# Patient Record
Sex: Female | Born: 1935 | Race: White | Hispanic: No | Marital: Married | Smoking: Never smoker
Health system: Southern US, Community
[De-identification: ages and names within clinical notes are randomized; demographics above are authoritative.]

## PROBLEM LIST (undated history)

## (undated) DIAGNOSIS — E079 Disorder of thyroid, unspecified: Secondary | ICD-10-CM

## (undated) DIAGNOSIS — N200 Calculus of kidney: Secondary | ICD-10-CM

## (undated) DIAGNOSIS — K529 Noninfective gastroenteritis and colitis, unspecified: Secondary | ICD-10-CM

## (undated) DIAGNOSIS — K5790 Diverticulosis of intestine, part unspecified, without perforation or abscess without bleeding: Secondary | ICD-10-CM

## (undated) DIAGNOSIS — E039 Hypothyroidism, unspecified: Secondary | ICD-10-CM

## (undated) DIAGNOSIS — I10 Essential (primary) hypertension: Secondary | ICD-10-CM

## (undated) DIAGNOSIS — G309 Alzheimer's disease, unspecified: Secondary | ICD-10-CM

## (undated) DIAGNOSIS — D1803 Hemangioma of intra-abdominal structures: Secondary | ICD-10-CM

## (undated) DIAGNOSIS — F0280 Dementia in other diseases classified elsewhere without behavioral disturbance: Secondary | ICD-10-CM

## (undated) DIAGNOSIS — K52832 Lymphocytic colitis: Secondary | ICD-10-CM

## (undated) HISTORY — DX: Alzheimer's disease, unspecified: G30.9

## (undated) HISTORY — DX: Dementia in other diseases classified elsewhere, unspecified severity, without behavioral disturbance, psychotic disturbance, mood disturbance, and anxiety: F02.80

## (undated) HISTORY — DX: Lymphocytic colitis: K52.832

## (undated) HISTORY — DX: Diverticulosis of intestine, part unspecified, without perforation or abscess without bleeding: K57.90

## (undated) HISTORY — PX: EYE SURGERY: SHX253

## (undated) HISTORY — DX: Hemangioma of intra-abdominal structures: D18.03

## (undated) HISTORY — DX: Calculus of kidney: N20.0

## (undated) HISTORY — PX: OTHER SURGICAL HISTORY: SHX169

## (undated) HISTORY — PX: BREAST LUMPECTOMY: SHX2

## (undated) HISTORY — DX: Hypothyroidism, unspecified: E03.9

---

## 1979-08-28 HISTORY — PX: OTHER SURGICAL HISTORY: SHX169

## 1980-08-27 HISTORY — PX: BREAST CYST ASPIRATION: SHX578

## 1998-05-17 ENCOUNTER — Ambulatory Visit (HOSPITAL_COMMUNITY): Admission: RE | Admit: 1998-05-17 | Discharge: 1998-05-17 | Payer: Self-pay | Admitting: *Deleted

## 1999-09-14 ENCOUNTER — Encounter: Admission: RE | Admit: 1999-09-14 | Discharge: 1999-10-31 | Payer: Self-pay | Admitting: *Deleted

## 2000-06-21 ENCOUNTER — Other Ambulatory Visit: Admission: RE | Admit: 2000-06-21 | Discharge: 2000-06-21 | Payer: Self-pay | Admitting: Obstetrics and Gynecology

## 2001-06-27 ENCOUNTER — Other Ambulatory Visit: Admission: RE | Admit: 2001-06-27 | Discharge: 2001-06-27 | Payer: Self-pay | Admitting: Obstetrics and Gynecology

## 2003-02-03 ENCOUNTER — Encounter: Payer: Self-pay | Admitting: Internal Medicine

## 2003-02-03 ENCOUNTER — Encounter: Admission: RE | Admit: 2003-02-03 | Discharge: 2003-02-03 | Payer: Self-pay | Admitting: Internal Medicine

## 2004-01-21 ENCOUNTER — Ambulatory Visit (HOSPITAL_COMMUNITY): Admission: RE | Admit: 2004-01-21 | Discharge: 2004-01-21 | Payer: Self-pay | Admitting: Gastroenterology

## 2004-08-27 HISTORY — PX: OTHER SURGICAL HISTORY: SHX169

## 2004-11-13 ENCOUNTER — Encounter: Admission: RE | Admit: 2004-11-13 | Discharge: 2005-01-03 | Payer: Self-pay | Admitting: Family Medicine

## 2005-01-08 ENCOUNTER — Encounter: Admission: RE | Admit: 2005-01-08 | Discharge: 2005-01-08 | Payer: Self-pay | Admitting: Orthopaedic Surgery

## 2005-01-09 ENCOUNTER — Ambulatory Visit (HOSPITAL_BASED_OUTPATIENT_CLINIC_OR_DEPARTMENT_OTHER): Admission: RE | Admit: 2005-01-09 | Discharge: 2005-01-09 | Payer: Self-pay | Admitting: Orthopaedic Surgery

## 2005-01-09 ENCOUNTER — Ambulatory Visit (HOSPITAL_COMMUNITY): Admission: RE | Admit: 2005-01-09 | Discharge: 2005-01-09 | Payer: Self-pay | Admitting: Orthopaedic Surgery

## 2005-01-11 ENCOUNTER — Encounter: Admission: RE | Admit: 2005-01-11 | Discharge: 2005-04-11 | Payer: Self-pay | Admitting: Orthopaedic Surgery

## 2010-02-24 DIAGNOSIS — K52832 Lymphocytic colitis: Secondary | ICD-10-CM

## 2010-02-24 HISTORY — DX: Lymphocytic colitis: K52.832

## 2011-01-02 ENCOUNTER — Other Ambulatory Visit: Payer: Self-pay | Admitting: Internal Medicine

## 2011-01-02 DIAGNOSIS — R945 Abnormal results of liver function studies: Secondary | ICD-10-CM

## 2011-01-05 ENCOUNTER — Ambulatory Visit
Admission: RE | Admit: 2011-01-05 | Discharge: 2011-01-05 | Disposition: A | Discharge status: Home / Self Care | Payer: Medicare Other | Source: Ambulatory Visit | Attending: Internal Medicine | Admitting: Internal Medicine

## 2011-01-05 DIAGNOSIS — R945 Abnormal results of liver function studies: Secondary | ICD-10-CM

## 2012-02-19 ENCOUNTER — Other Ambulatory Visit: Payer: Self-pay | Admitting: Internal Medicine

## 2012-02-19 DIAGNOSIS — R945 Abnormal results of liver function studies: Secondary | ICD-10-CM

## 2012-02-26 ENCOUNTER — Ambulatory Visit
Admission: RE | Admit: 2012-02-26 | Discharge: 2012-02-26 | Disposition: A | Discharge status: Home / Self Care | Payer: Medicare Other | Source: Ambulatory Visit | Attending: Internal Medicine | Admitting: Internal Medicine

## 2012-02-26 DIAGNOSIS — R945 Abnormal results of liver function studies: Secondary | ICD-10-CM

## 2014-09-22 DIAGNOSIS — Z1231 Encounter for screening mammogram for malignant neoplasm of breast: Secondary | ICD-10-CM | POA: Diagnosis not present

## 2014-10-04 DIAGNOSIS — E039 Hypothyroidism, unspecified: Secondary | ICD-10-CM | POA: Diagnosis not present

## 2014-10-04 DIAGNOSIS — E559 Vitamin D deficiency, unspecified: Secondary | ICD-10-CM | POA: Diagnosis not present

## 2014-10-04 DIAGNOSIS — I10 Essential (primary) hypertension: Secondary | ICD-10-CM | POA: Diagnosis not present

## 2014-10-07 ENCOUNTER — Other Ambulatory Visit: Payer: Self-pay | Admitting: Internal Medicine

## 2014-10-07 DIAGNOSIS — R740 Nonspecific elevation of levels of transaminase and lactic acid dehydrogenase [LDH]: Secondary | ICD-10-CM | POA: Diagnosis not present

## 2014-10-07 DIAGNOSIS — R7402 Elevation of levels of lactic acid dehydrogenase (LDH): Secondary | ICD-10-CM

## 2014-10-07 DIAGNOSIS — E559 Vitamin D deficiency, unspecified: Secondary | ICD-10-CM | POA: Diagnosis not present

## 2014-10-07 DIAGNOSIS — E039 Hypothyroidism, unspecified: Secondary | ICD-10-CM | POA: Diagnosis not present

## 2014-10-07 DIAGNOSIS — I10 Essential (primary) hypertension: Secondary | ICD-10-CM | POA: Diagnosis not present

## 2014-10-15 ENCOUNTER — Ambulatory Visit
Admission: RE | Admit: 2014-10-15 | Discharge: 2014-10-15 | Disposition: A | Discharge status: Home / Self Care | Payer: Commercial Managed Care - HMO | Source: Ambulatory Visit | Attending: Internal Medicine | Admitting: Internal Medicine

## 2014-10-15 DIAGNOSIS — R7401 Elevation of levels of liver transaminase levels: Secondary | ICD-10-CM

## 2014-10-15 DIAGNOSIS — R740 Nonspecific elevation of levels of transaminase and lactic acid dehydrogenase [LDH]: Principal | ICD-10-CM

## 2014-10-15 DIAGNOSIS — N281 Cyst of kidney, acquired: Secondary | ICD-10-CM | POA: Diagnosis not present

## 2014-11-22 DIAGNOSIS — H2513 Age-related nuclear cataract, bilateral: Secondary | ICD-10-CM | POA: Diagnosis not present

## 2014-11-22 DIAGNOSIS — H524 Presbyopia: Secondary | ICD-10-CM | POA: Diagnosis not present

## 2015-01-17 DIAGNOSIS — H8110 Benign paroxysmal vertigo, unspecified ear: Secondary | ICD-10-CM | POA: Diagnosis not present

## 2015-01-31 DIAGNOSIS — H8110 Benign paroxysmal vertigo, unspecified ear: Secondary | ICD-10-CM | POA: Diagnosis not present

## 2015-03-24 DIAGNOSIS — Z01419 Encounter for gynecological examination (general) (routine) without abnormal findings: Secondary | ICD-10-CM | POA: Diagnosis not present

## 2015-04-07 DIAGNOSIS — E559 Vitamin D deficiency, unspecified: Secondary | ICD-10-CM | POA: Diagnosis not present

## 2015-04-07 DIAGNOSIS — N390 Urinary tract infection, site not specified: Secondary | ICD-10-CM | POA: Diagnosis not present

## 2015-04-07 DIAGNOSIS — I10 Essential (primary) hypertension: Secondary | ICD-10-CM | POA: Diagnosis not present

## 2015-04-07 DIAGNOSIS — E039 Hypothyroidism, unspecified: Secondary | ICD-10-CM | POA: Diagnosis not present

## 2015-04-14 DIAGNOSIS — E039 Hypothyroidism, unspecified: Secondary | ICD-10-CM | POA: Diagnosis not present

## 2015-04-14 DIAGNOSIS — R740 Nonspecific elevation of levels of transaminase and lactic acid dehydrogenase [LDH]: Secondary | ICD-10-CM | POA: Diagnosis not present

## 2015-04-14 DIAGNOSIS — E559 Vitamin D deficiency, unspecified: Secondary | ICD-10-CM | POA: Diagnosis not present

## 2015-04-14 DIAGNOSIS — I10 Essential (primary) hypertension: Secondary | ICD-10-CM | POA: Diagnosis not present

## 2015-06-16 DIAGNOSIS — Z23 Encounter for immunization: Secondary | ICD-10-CM | POA: Diagnosis not present

## 2015-06-29 DIAGNOSIS — R21 Rash and other nonspecific skin eruption: Secondary | ICD-10-CM | POA: Diagnosis not present

## 2015-07-07 DIAGNOSIS — T500X5A Adverse effect of mineralocorticoids and their antagonists, initial encounter: Secondary | ICD-10-CM | POA: Diagnosis not present

## 2015-09-05 ENCOUNTER — Telehealth: Payer: Self-pay

## 2015-09-05 NOTE — Telephone Encounter (Signed)
Prior auth for Eliquis 2.5mg sent to [facility]. 

## 2015-09-26 DIAGNOSIS — Z1231 Encounter for screening mammogram for malignant neoplasm of breast: Secondary | ICD-10-CM | POA: Diagnosis not present

## 2015-10-11 DIAGNOSIS — E559 Vitamin D deficiency, unspecified: Secondary | ICD-10-CM | POA: Diagnosis not present

## 2015-10-11 DIAGNOSIS — E039 Hypothyroidism, unspecified: Secondary | ICD-10-CM | POA: Diagnosis not present

## 2015-10-11 DIAGNOSIS — I10 Essential (primary) hypertension: Secondary | ICD-10-CM | POA: Diagnosis not present

## 2015-10-19 DIAGNOSIS — I10 Essential (primary) hypertension: Secondary | ICD-10-CM | POA: Diagnosis not present

## 2015-10-19 DIAGNOSIS — Z1389 Encounter for screening for other disorder: Secondary | ICD-10-CM | POA: Diagnosis not present

## 2015-10-19 DIAGNOSIS — K7689 Other specified diseases of liver: Secondary | ICD-10-CM | POA: Diagnosis not present

## 2015-10-19 DIAGNOSIS — R946 Abnormal results of thyroid function studies: Secondary | ICD-10-CM | POA: Diagnosis not present

## 2015-11-22 DIAGNOSIS — H2511 Age-related nuclear cataract, right eye: Secondary | ICD-10-CM | POA: Diagnosis not present

## 2015-11-22 DIAGNOSIS — H53002 Unspecified amblyopia, left eye: Secondary | ICD-10-CM | POA: Diagnosis not present

## 2015-11-22 DIAGNOSIS — H524 Presbyopia: Secondary | ICD-10-CM | POA: Diagnosis not present

## 2015-12-27 DIAGNOSIS — H35033 Hypertensive retinopathy, bilateral: Secondary | ICD-10-CM | POA: Diagnosis not present

## 2015-12-27 DIAGNOSIS — H1131 Conjunctival hemorrhage, right eye: Secondary | ICD-10-CM | POA: Diagnosis not present

## 2016-01-24 DIAGNOSIS — H5203 Hypermetropia, bilateral: Secondary | ICD-10-CM | POA: Diagnosis not present

## 2016-01-24 DIAGNOSIS — H5213 Myopia, bilateral: Secondary | ICD-10-CM | POA: Diagnosis not present

## 2016-01-24 DIAGNOSIS — Z0100 Encounter for examination of eyes and vision without abnormal findings: Secondary | ICD-10-CM | POA: Diagnosis not present

## 2016-01-24 DIAGNOSIS — H52209 Unspecified astigmatism, unspecified eye: Secondary | ICD-10-CM | POA: Diagnosis not present

## 2016-01-24 DIAGNOSIS — H524 Presbyopia: Secondary | ICD-10-CM | POA: Diagnosis not present

## 2016-04-12 DIAGNOSIS — I10 Essential (primary) hypertension: Secondary | ICD-10-CM | POA: Diagnosis not present

## 2016-04-12 DIAGNOSIS — R946 Abnormal results of thyroid function studies: Secondary | ICD-10-CM | POA: Diagnosis not present

## 2016-04-13 DIAGNOSIS — Z1389 Encounter for screening for other disorder: Secondary | ICD-10-CM | POA: Diagnosis not present

## 2016-04-13 DIAGNOSIS — Z130 Encounter for screening for diseases of the blood and blood-forming organs and certain disorders involving the immune mechanism: Secondary | ICD-10-CM | POA: Diagnosis not present

## 2016-04-13 DIAGNOSIS — Z01419 Encounter for gynecological examination (general) (routine) without abnormal findings: Secondary | ICD-10-CM | POA: Diagnosis not present

## 2016-04-17 DIAGNOSIS — Z01419 Encounter for gynecological examination (general) (routine) without abnormal findings: Secondary | ICD-10-CM | POA: Diagnosis not present

## 2016-04-25 DIAGNOSIS — Z0000 Encounter for general adult medical examination without abnormal findings: Secondary | ICD-10-CM | POA: Diagnosis not present

## 2016-04-25 DIAGNOSIS — R740 Nonspecific elevation of levels of transaminase and lactic acid dehydrogenase [LDH]: Secondary | ICD-10-CM | POA: Diagnosis not present

## 2016-04-25 DIAGNOSIS — I10 Essential (primary) hypertension: Secondary | ICD-10-CM | POA: Diagnosis not present

## 2016-04-25 DIAGNOSIS — Z23 Encounter for immunization: Secondary | ICD-10-CM | POA: Diagnosis not present

## 2016-04-25 DIAGNOSIS — E039 Hypothyroidism, unspecified: Secondary | ICD-10-CM | POA: Diagnosis not present

## 2016-06-18 DIAGNOSIS — L259 Unspecified contact dermatitis, unspecified cause: Secondary | ICD-10-CM | POA: Diagnosis not present

## 2016-08-22 DIAGNOSIS — H04123 Dry eye syndrome of bilateral lacrimal glands: Secondary | ICD-10-CM | POA: Diagnosis not present

## 2016-08-22 DIAGNOSIS — R51 Headache: Secondary | ICD-10-CM | POA: Diagnosis not present

## 2016-10-08 DIAGNOSIS — Z1231 Encounter for screening mammogram for malignant neoplasm of breast: Secondary | ICD-10-CM | POA: Diagnosis not present

## 2016-10-24 DIAGNOSIS — I10 Essential (primary) hypertension: Secondary | ICD-10-CM | POA: Diagnosis not present

## 2016-10-24 DIAGNOSIS — Z0000 Encounter for general adult medical examination without abnormal findings: Secondary | ICD-10-CM | POA: Diagnosis not present

## 2016-10-24 DIAGNOSIS — E039 Hypothyroidism, unspecified: Secondary | ICD-10-CM | POA: Diagnosis not present

## 2016-10-24 DIAGNOSIS — E559 Vitamin D deficiency, unspecified: Secondary | ICD-10-CM | POA: Diagnosis not present

## 2016-12-05 DIAGNOSIS — H52221 Regular astigmatism, right eye: Secondary | ICD-10-CM | POA: Diagnosis not present

## 2016-12-05 DIAGNOSIS — H524 Presbyopia: Secondary | ICD-10-CM | POA: Diagnosis not present

## 2016-12-05 DIAGNOSIS — H04123 Dry eye syndrome of bilateral lacrimal glands: Secondary | ICD-10-CM | POA: Diagnosis not present

## 2016-12-05 DIAGNOSIS — H35033 Hypertensive retinopathy, bilateral: Secondary | ICD-10-CM | POA: Diagnosis not present

## 2016-12-05 DIAGNOSIS — H2511 Age-related nuclear cataract, right eye: Secondary | ICD-10-CM | POA: Diagnosis not present

## 2016-12-05 DIAGNOSIS — H53002 Unspecified amblyopia, left eye: Secondary | ICD-10-CM | POA: Diagnosis not present

## 2016-12-05 DIAGNOSIS — H5201 Hypermetropia, right eye: Secondary | ICD-10-CM | POA: Diagnosis not present

## 2017-03-14 DIAGNOSIS — H5203 Hypermetropia, bilateral: Secondary | ICD-10-CM | POA: Diagnosis not present

## 2017-03-14 DIAGNOSIS — H524 Presbyopia: Secondary | ICD-10-CM | POA: Diagnosis not present

## 2017-03-14 DIAGNOSIS — H52209 Unspecified astigmatism, unspecified eye: Secondary | ICD-10-CM | POA: Diagnosis not present

## 2017-03-14 DIAGNOSIS — H5213 Myopia, bilateral: Secondary | ICD-10-CM | POA: Diagnosis not present

## 2017-04-04 DIAGNOSIS — Z780 Asymptomatic menopausal state: Secondary | ICD-10-CM | POA: Diagnosis not present

## 2017-04-04 DIAGNOSIS — E559 Vitamin D deficiency, unspecified: Secondary | ICD-10-CM | POA: Diagnosis not present

## 2017-04-04 DIAGNOSIS — E039 Hypothyroidism, unspecified: Secondary | ICD-10-CM | POA: Diagnosis not present

## 2017-04-04 DIAGNOSIS — N390 Urinary tract infection, site not specified: Secondary | ICD-10-CM | POA: Diagnosis not present

## 2017-04-04 DIAGNOSIS — I10 Essential (primary) hypertension: Secondary | ICD-10-CM | POA: Diagnosis not present

## 2017-04-17 DIAGNOSIS — Z01419 Encounter for gynecological examination (general) (routine) without abnormal findings: Secondary | ICD-10-CM | POA: Diagnosis not present

## 2017-04-18 DIAGNOSIS — K922 Gastrointestinal hemorrhage, unspecified: Secondary | ICD-10-CM | POA: Diagnosis not present

## 2017-05-16 DIAGNOSIS — M859 Disorder of bone density and structure, unspecified: Secondary | ICD-10-CM | POA: Diagnosis not present

## 2017-05-16 DIAGNOSIS — M8580 Other specified disorders of bone density and structure, unspecified site: Secondary | ICD-10-CM | POA: Diagnosis not present

## 2017-05-16 DIAGNOSIS — Z0000 Encounter for general adult medical examination without abnormal findings: Secondary | ICD-10-CM | POA: Diagnosis not present

## 2017-05-17 DIAGNOSIS — J101 Influenza due to other identified influenza virus with other respiratory manifestations: Secondary | ICD-10-CM | POA: Diagnosis not present

## 2017-05-23 DIAGNOSIS — E039 Hypothyroidism, unspecified: Secondary | ICD-10-CM | POA: Diagnosis not present

## 2017-05-23 DIAGNOSIS — Z0000 Encounter for general adult medical examination without abnormal findings: Secondary | ICD-10-CM | POA: Diagnosis not present

## 2017-05-23 DIAGNOSIS — I10 Essential (primary) hypertension: Secondary | ICD-10-CM | POA: Diagnosis not present

## 2017-05-23 DIAGNOSIS — K7689 Other specified diseases of liver: Secondary | ICD-10-CM | POA: Diagnosis not present

## 2017-06-17 DIAGNOSIS — M7751 Other enthesopathy of right foot: Secondary | ICD-10-CM | POA: Diagnosis not present

## 2017-06-17 DIAGNOSIS — G5761 Lesion of plantar nerve, right lower limb: Secondary | ICD-10-CM | POA: Diagnosis not present

## 2017-06-24 DIAGNOSIS — M7751 Other enthesopathy of right foot: Secondary | ICD-10-CM | POA: Diagnosis not present

## 2017-06-24 DIAGNOSIS — G5761 Lesion of plantar nerve, right lower limb: Secondary | ICD-10-CM | POA: Diagnosis not present

## 2017-07-08 DIAGNOSIS — G5761 Lesion of plantar nerve, right lower limb: Secondary | ICD-10-CM | POA: Diagnosis not present

## 2017-07-08 DIAGNOSIS — M7751 Other enthesopathy of right foot: Secondary | ICD-10-CM | POA: Diagnosis not present

## 2017-11-12 DIAGNOSIS — E039 Hypothyroidism, unspecified: Secondary | ICD-10-CM | POA: Diagnosis not present

## 2017-11-12 DIAGNOSIS — I10 Essential (primary) hypertension: Secondary | ICD-10-CM | POA: Diagnosis not present

## 2017-11-20 DIAGNOSIS — I10 Essential (primary) hypertension: Secondary | ICD-10-CM | POA: Diagnosis not present

## 2017-11-20 DIAGNOSIS — E039 Hypothyroidism, unspecified: Secondary | ICD-10-CM | POA: Diagnosis not present

## 2017-11-20 DIAGNOSIS — R413 Other amnesia: Secondary | ICD-10-CM | POA: Diagnosis not present

## 2017-11-20 DIAGNOSIS — R945 Abnormal results of liver function studies: Secondary | ICD-10-CM | POA: Diagnosis not present

## 2017-11-25 ENCOUNTER — Other Ambulatory Visit: Payer: Self-pay | Admitting: Internal Medicine

## 2017-11-25 DIAGNOSIS — R413 Other amnesia: Secondary | ICD-10-CM

## 2017-12-03 ENCOUNTER — Ambulatory Visit
Admission: RE | Admit: 2017-12-03 | Discharge: 2017-12-03 | Disposition: A | Discharge status: Home / Self Care | Payer: Commercial Managed Care - HMO | Source: Ambulatory Visit | Attending: Internal Medicine | Admitting: Internal Medicine

## 2017-12-03 DIAGNOSIS — R413 Other amnesia: Secondary | ICD-10-CM | POA: Diagnosis not present

## 2017-12-05 DIAGNOSIS — R197 Diarrhea, unspecified: Secondary | ICD-10-CM | POA: Diagnosis not present

## 2017-12-11 DIAGNOSIS — H53002 Unspecified amblyopia, left eye: Secondary | ICD-10-CM | POA: Diagnosis not present

## 2017-12-11 DIAGNOSIS — H04123 Dry eye syndrome of bilateral lacrimal glands: Secondary | ICD-10-CM | POA: Diagnosis not present

## 2017-12-11 DIAGNOSIS — H5201 Hypermetropia, right eye: Secondary | ICD-10-CM | POA: Diagnosis not present

## 2017-12-11 DIAGNOSIS — H52221 Regular astigmatism, right eye: Secondary | ICD-10-CM | POA: Diagnosis not present

## 2017-12-11 DIAGNOSIS — H524 Presbyopia: Secondary | ICD-10-CM | POA: Diagnosis not present

## 2017-12-11 DIAGNOSIS — H35033 Hypertensive retinopathy, bilateral: Secondary | ICD-10-CM | POA: Diagnosis not present

## 2017-12-11 DIAGNOSIS — H2511 Age-related nuclear cataract, right eye: Secondary | ICD-10-CM | POA: Diagnosis not present

## 2017-12-18 DIAGNOSIS — I10 Essential (primary) hypertension: Secondary | ICD-10-CM | POA: Diagnosis not present

## 2017-12-18 DIAGNOSIS — D329 Benign neoplasm of meninges, unspecified: Secondary | ICD-10-CM | POA: Diagnosis not present

## 2017-12-18 DIAGNOSIS — R413 Other amnesia: Secondary | ICD-10-CM | POA: Diagnosis not present

## 2017-12-18 DIAGNOSIS — Z79899 Other long term (current) drug therapy: Secondary | ICD-10-CM | POA: Diagnosis not present

## 2017-12-18 DIAGNOSIS — E559 Vitamin D deficiency, unspecified: Secondary | ICD-10-CM | POA: Diagnosis not present

## 2017-12-18 DIAGNOSIS — E039 Hypothyroidism, unspecified: Secondary | ICD-10-CM | POA: Diagnosis not present

## 2017-12-29 DIAGNOSIS — L255 Unspecified contact dermatitis due to plants, except food: Secondary | ICD-10-CM | POA: Diagnosis not present

## 2018-01-14 ENCOUNTER — Encounter (HOSPITAL_BASED_OUTPATIENT_CLINIC_OR_DEPARTMENT_OTHER): Payer: Self-pay | Admitting: Emergency Medicine

## 2018-01-14 ENCOUNTER — Emergency Department (HOSPITAL_BASED_OUTPATIENT_CLINIC_OR_DEPARTMENT_OTHER)
Admission: EM | Admit: 2018-01-14 | Discharge: 2018-01-14 | Disposition: A | Discharge status: Home / Self Care | Payer: Medicare HMO | Attending: Emergency Medicine | Admitting: Emergency Medicine

## 2018-01-14 ENCOUNTER — Other Ambulatory Visit: Payer: Self-pay

## 2018-01-14 DIAGNOSIS — N3001 Acute cystitis with hematuria: Secondary | ICD-10-CM

## 2018-01-14 DIAGNOSIS — Z79899 Other long term (current) drug therapy: Secondary | ICD-10-CM | POA: Diagnosis not present

## 2018-01-14 DIAGNOSIS — E876 Hypokalemia: Secondary | ICD-10-CM | POA: Insufficient documentation

## 2018-01-14 DIAGNOSIS — I10 Essential (primary) hypertension: Secondary | ICD-10-CM | POA: Insufficient documentation

## 2018-01-14 DIAGNOSIS — E860 Dehydration: Secondary | ICD-10-CM | POA: Diagnosis not present

## 2018-01-14 DIAGNOSIS — R42 Dizziness and giddiness: Secondary | ICD-10-CM | POA: Diagnosis not present

## 2018-01-14 DIAGNOSIS — R531 Weakness: Secondary | ICD-10-CM | POA: Diagnosis not present

## 2018-01-14 DIAGNOSIS — R197 Diarrhea, unspecified: Secondary | ICD-10-CM | POA: Diagnosis present

## 2018-01-14 HISTORY — DX: Noninfective gastroenteritis and colitis, unspecified: K52.9

## 2018-01-14 HISTORY — DX: Essential (primary) hypertension: I10

## 2018-01-14 HISTORY — DX: Disorder of thyroid, unspecified: E07.9

## 2018-01-14 LAB — COMPREHENSIVE METABOLIC PANEL
ALK PHOS: 85 U/L (ref 38–126)
ALT: 53 U/L (ref 14–54)
AST: 74 U/L — AB (ref 15–41)
Albumin: 4 g/dL (ref 3.5–5.0)
Anion gap: 14 (ref 5–15)
BILIRUBIN TOTAL: 0.5 mg/dL (ref 0.3–1.2)
BUN: 33 mg/dL — ABNORMAL HIGH (ref 6–20)
CALCIUM: 8.8 mg/dL — AB (ref 8.9–10.3)
CO2: 24 mmol/L (ref 22–32)
CREATININE: 1.56 mg/dL — AB (ref 0.44–1.00)
Chloride: 96 mmol/L — ABNORMAL LOW (ref 101–111)
GFR calc Af Amer: 34 mL/min — ABNORMAL LOW (ref >60)
GFR calc non Af Amer: 30 mL/min — ABNORMAL LOW (ref >60)
Glucose, Bld: 140 mg/dL — ABNORMAL HIGH (ref 65–99)
Potassium: 2.6 mmol/L — CL (ref 3.5–5.1)
Sodium: 134 mmol/L — ABNORMAL LOW (ref 135–145)
TOTAL PROTEIN: 6.9 g/dL (ref 6.5–8.1)

## 2018-01-14 LAB — URINALYSIS, ROUTINE W REFLEX MICROSCOPIC
BILIRUBIN URINE: NEGATIVE
Glucose, UA: NEGATIVE mg/dL
KETONES UR: NEGATIVE mg/dL
NITRITE: NEGATIVE
PH: 6 (ref 5.0–8.0)
Protein, ur: 30 mg/dL — AB
Specific Gravity, Urine: 1.02 (ref 1.005–1.030)

## 2018-01-14 LAB — LIPASE, BLOOD: Lipase: 30 U/L (ref 11–51)

## 2018-01-14 LAB — URINALYSIS, MICROSCOPIC (REFLEX)

## 2018-01-14 LAB — CBC
HCT: 39.3 % (ref 36.0–46.0)
Hemoglobin: 13.8 g/dL (ref 12.0–15.0)
MCH: 31.4 pg (ref 26.0–34.0)
MCHC: 35.1 g/dL (ref 30.0–36.0)
MCV: 89.5 fL (ref 78.0–100.0)
Platelets: 166 10*3/uL (ref 150–400)
RBC: 4.39 MIL/uL (ref 3.87–5.11)
RDW: 13.8 % (ref 11.5–15.5)
WBC: 11.3 10*3/uL — ABNORMAL HIGH (ref 4.0–10.5)

## 2018-01-14 LAB — DIFFERENTIAL
BASOS PCT: 0 %
Basophils Absolute: 0 10*3/uL (ref 0.0–0.1)
Eosinophils Absolute: 0.5 10*3/uL (ref 0.0–0.7)
Eosinophils Relative: 5 %
Lymphocytes Relative: 11 %
Lymphs Abs: 1.3 10*3/uL (ref 0.7–4.0)
Monocytes Absolute: 1 10*3/uL (ref 0.1–1.0)
Monocytes Relative: 10 %
NEUTROS ABS: 8.1 10*3/uL — AB (ref 1.7–7.7)
Neutrophils Relative %: 74 %

## 2018-01-14 LAB — I-STAT CG4 LACTIC ACID, ED
LACTIC ACID, VENOUS: 2.48 mmol/L — AB (ref 0.5–1.9)
LACTIC ACID, VENOUS: 0.9 mmol/L (ref 0.5–1.9)

## 2018-01-14 LAB — MAGNESIUM: MAGNESIUM: 1.9 mg/dL (ref 1.7–2.4)

## 2018-01-14 MED ORDER — POTASSIUM CHLORIDE 10 MEQ/100ML IV SOLN
10.0000 meq | Freq: Once | INTRAVENOUS | Status: AC
  Administered 2018-01-14: 10 meq via INTRAVENOUS
  Filled 2018-01-14: qty 100

## 2018-01-14 MED ORDER — POTASSIUM CHLORIDE CRYS ER 20 MEQ PO TBCR
40.0000 meq | EXTENDED_RELEASE_TABLET | Freq: Once | ORAL | Status: AC
  Administered 2018-01-14: 40 meq via ORAL
  Filled 2018-01-14: qty 2

## 2018-01-14 MED ORDER — SODIUM CHLORIDE 0.9 % IV BOLUS
1000.0000 mL | Freq: Once | INTRAVENOUS | Status: AC
  Administered 2018-01-14: 1000 mL via INTRAVENOUS

## 2018-01-14 MED ORDER — CEPHALEXIN 500 MG PO CAPS: 500.0000 mg | ORAL_CAPSULE | Freq: Two times a day (BID) | ORAL | 0 refills | Status: AC

## 2018-01-14 MED FILL — CEPHALEXIN 500 MG CAPSULE: 500 | 5 days supply | Qty: 10 | Fill #0

## 2018-01-14 NOTE — ED Provider Notes (Signed)
Complains of generalized weakness for the past 3 or 4 days. She had one episode of diarrhea today. No nausea no vomiting no abdominal pain no fever no headache no chest pain no other associated symptoms on exam alert no distress lungs clear auscultation heart regular rate and rhythm abdomen bowel sounds soft nontender extremities without edema   Orlie Dakin, MD 01/14/18 1130

## 2018-01-14 NOTE — Discharge Instructions (Addendum)
Please be sure to keep your follow-up appointment with Dr. Maudie Mercury this week. Stop taking your hydrochlorothiazide (HCTZ) until you consult with your primary care provider this week. Your potassium was low today.  This should be retested by your primary care provider. Please be sure to stay well-hydrated.  This includes drinking at least eight 8oz glasses of water a day. There was evidence of a urinary tract infection on the urinalysis. Please take all of your antibiotics until finished!   You may develop abdominal discomfort or diarrhea from the antibiotic.  You may help offset this with probiotics which you can buy or get in yogurt. Do not eat or take the probiotics until 2 hours after your antibiotic.  Return to the ED for any worsening symptoms.

## 2018-01-14 NOTE — ED Triage Notes (Signed)
Has had several episodes of diarrhea and vomiting this am feels weak ,  Has hx of microscopic colitis

## 2018-01-14 NOTE — ED Notes (Signed)
Pt states feels better up to Br to void agagin. PA clarified wether pt needed to cont to take fluid pill

## 2018-01-14 NOTE — ED Provider Notes (Signed)
[facility] EMERGENCY DEPARTMENT Provider Note   CSN: [id number] Arrival date & time: 01/14/18  0854     History   Chief Complaint Chief Complaint  Patient presents with  . Emesis  . Diarrhea    HPI Alexandria Murphy is a 84 y.o. female.  HPI   Alexandria Murphy is a 84 y.o. female, with a history of HTN, presenting to the ED with diarrhea beginning this morning around 8 AM. Has had one stool. Also endorses generalized weakness and lightheadedness for last 3-4 days. Denies recent antibiotic use or hospitalizations.  Called her PCP, Jani Gravel, this morning and was told to come to the ED. patient has a follow-up appointment with Dr. Maudie Mercury on May 23. Denies fever/chills, N/V, hematochezia/melena, abdominal pain, shortness of breath, chest pain, cough, urinary symptoms, or any other complaints.      Past Medical History:  Diagnosis Date  . Colitis   . Hypertension   . Thyroid disease     There are no active problems to display for this patient.   History reviewed. No pertinent surgical history.   OB History   None      Home Medications    Prior to Admission medications   Medication Sig Start Date End Date Taking? Authorizing Provider  hydrochlorothiazide (HYDRODIURIL) 25 MG tablet Take 25 mg by mouth daily.   Yes [provider]  levothyroxine (SYNTHROID, LEVOTHROID) 100 MCG tablet Take 100 mcg by mouth daily before breakfast.   Yes [provider]  lisinopril (PRINIVIL,ZESTRIL) 40 MG tablet Take 40 mg by mouth daily.   Yes [provider]  cephALEXin (KEFLEX) 500 MG capsule Take 1 capsule (500 mg total) by mouth 2 (two) times daily for 5 days. 01/14/18 01/19/18  Lorayne Bender, PA-C    Family History No family history on file.  Social History Social History   Tobacco Use  . Smoking status: Never Smoker  . Smokeless tobacco: Never Used  Substance Use Topics  . Alcohol use: Not on file  . Drug use: Not on file      Allergies   Entocort ec [budesonide] and Lipitor [atorvastatin]   Review of Systems Review of Systems  Constitutional: Negative for chills and fever.  Respiratory: Negative for cough and shortness of breath.   Cardiovascular: Negative for chest pain.  Gastrointestinal: Positive for diarrhea. Negative for abdominal pain, nausea and vomiting.  Genitourinary: Negative for dysuria and hematuria.  Neurological: Positive for weakness (generalized) and light-headedness.  All other systems reviewed and are negative.    Physical Exam Updated Vital Signs BP (!) 87/41 (BP Location: Left Arm)   Pulse 76   Temp 98 F (36.7 C) (Oral)   Resp 18   Ht 5' (1.524 m)   Wt 49.9 kg (110 lb)   SpO2 100%   BMI 21.48 kg/m   Physical Exam  Constitutional: She is oriented to person, place, and time. She appears well-developed and well-nourished. No distress.  HENT:  Head: Normocephalic and atraumatic.  Eyes: Pupils are equal, round, and reactive to light. Conjunctivae and EOM are normal.  Neck: Neck supple.  Cardiovascular: Normal rate, regular rhythm, normal heart sounds and intact distal pulses.  Pulmonary/Chest: Effort normal and breath sounds normal. No respiratory distress.  Abdominal: Soft. Bowel sounds are normal. There is no tenderness. There is no guarding.  Musculoskeletal: She exhibits no edema.  Lymphadenopathy:    She has no cervical adenopathy.  Neurological: She is alert and oriented to person,  place, and time.  No sensory deficits. Strength 5/5 in all extremities. No gait disturbance. Coordination intact including heel to shin and finger to nose. Cranial nerves III-XII grossly intact. No facial droop.   Skin: Skin is warm and dry. She is not diaphoretic.  Psychiatric: She has a normal mood and affect. Her behavior is normal.  Nursing note and vitals reviewed.    ED Treatments / Results  Labs (all labs ordered are listed, but only abnormal results are displayed) Labs  Reviewed  COMPREHENSIVE METABOLIC PANEL - Abnormal; Notable for the following components:      Result Value   Sodium 134 (*)    Potassium 2.6 (*)    Chloride 96 (*)    Glucose, Bld 140 (*)    BUN 33 (*)    Creatinine, Ser 1.56 (*)    Calcium 8.8 (*)    AST 74 (*)    GFR calc non Af Amer 30 (*)    GFR calc Af Amer 34 (*)    All other components within normal limits  CBC - Abnormal; Notable for the following components:   WBC 11.3 (*)    All other components within normal limits  URINALYSIS, ROUTINE W REFLEX MICROSCOPIC - Abnormal; Notable for the following components:   Hgb urine dipstick TRACE (*)    Protein, ur 30 (*)    Leukocytes, UA SMALL (*)    All other components within normal limits  DIFFERENTIAL - Abnormal; Notable for the following components:   Neutro Abs 8.1 (*)    All other components within normal limits  URINALYSIS, MICROSCOPIC (REFLEX) - Abnormal; Notable for the following components:   Bacteria, UA MANY (*)    All other components within normal limits  I-STAT CG4 LACTIC ACID, ED - Abnormal; Notable for the following components:   Lactic Acid, Venous 2.48 (*)    All other components within normal limits  URINE CULTURE  LIPASE, BLOOD  MAGNESIUM  I-STAT CG4 LACTIC ACID, ED    EKG EKG Interpretation  Date/Time:  Tuesday Jan 14 2018 10:27:59 EDT Ventricular Rate:  66 PR Interval:    QRS Duration: 83 QT Interval:  384 QTC Calculation: 403 R Axis:   75 Text Interpretation:  Sinus rhythm RSR' in V1 or V2, probably normal variant Baseline wander in lead(s) II No old tracing to compare Confirmed by [place], Inocente Salles [phone]) on 01/14/2018 10:39:30 AM   Radiology No results found.  Procedures Procedures (including critical care time)  Medications Ordered in ED Medications  sodium chloride 0.9 % bolus 1,000 mL (0 mLs Intravenous Stopped 01/14/18 1236)  potassium chloride 10 mEq in 100 mL IVPB (0 mEq Intravenous Stopped 01/14/18 1447)    Followed by   potassium chloride 10 mEq in 100 mL IVPB (0 mEq Intravenous Stopped 01/14/18 1145)  potassium chloride SA (K-DUR,KLOR-CON) CR tablet 40 mEq (40 mEq Oral Given 01/14/18 1042)  sodium chloride 0.9 % bolus 1,000 mL (0 mLs Intravenous Stopped 01/14/18 1447)     Initial Impression / Assessment and Plan / ED Course  I have reviewed the triage vital signs and the nursing notes.  Pertinent labs & imaging results that were available during my care of the patient were reviewed by me and considered in my medical decision making (see chart for details).  Clinical Course as of Jan 14 2017  Tue Jan 14, 2018  1108 Spoke with Caren Griffins from Dr. Julianne Rice office to determine if there was anything specific he was concerned with for this  patient. Dr. Maudie Mercury is not in the office this morning, but Caren Griffins states the patient's daughter called into the office last night due to concern for her mother feeling ill and not acting herself. She was told to bring her mother into the ED last night.  Someone from the PCP office called them this morning to check in and left a message reiterating the instructions to come to the ED.   [SJ]  1221 Patient states she feels much better.   [SJ]  1508 Patient states she feels back to normal.  Ambulated without feelings of lightheadedness or instability.   [SJ]    Clinical Course User Index [SJ] Joy, Shawn C, PA-C    Patient presents with generalized weakness and lightheadedness.  Elevated creatinine and BUN with no previous with which to compare, accompanied by initial hypotension. Suspected to be due to dehydration.  Hypokalemia also noted. These may be due to the patient's HCTZ. Patient felt much better following fluid resuscitation.  BP rose to a more acceptable level. Evidence of UTI on UA. Patient is nontoxic appearing, afebrile, not tachycardic, not tachypneic, maintains excellent SPO2 on room air, and is in no apparent distress. I do not think that the patient's elevated lactic  acid level and initial hypotension are due to sepsis, rather I think they are connected with her dehydration. Patient has close follow-up with her PCP. The patient was given instructions for home care as well as return precautions. Patient voices understanding of these instructions, accepts the plan, and is comfortable with discharge.    Findings and plan of care discussed with Sam Jacubowitz. Dr. Winfred Leeds personally evaluated and examined this patient.  Vitals:   01/14/18 1115 01/14/18 1130 01/14/18 1145 01/14/18 1200  BP:  (!) 102/54  (!) 97/59  Pulse: 66 64 64 66  Resp: 16 12 17 14   Temp:      TempSrc:      SpO2: 98% 100% 100% 97%  Weight:      Height:       Vitals:   01/14/18 1145 01/14/18 1200 01/14/18 1456 01/14/18 1526  BP:  (!) 97/59 108/80 129/69  Pulse: 64 66 75   Resp: 17 14 17    Temp:   98 F (36.7 C)   TempSrc:   Oral   SpO2: 100% 97% 99%   Weight:      Height:         Orthostatic VS for the past 24 hrs:  BP- Lying Pulse- Lying BP- Sitting Pulse- Sitting BP- Standing at 0 minutes Pulse- Standing at 0 minutes  01/14/18 1029 101/47 65 110/58 75 114/54 67     Final Clinical Impressions(s) / ED Diagnoses   Final diagnoses:  Dehydration  Acute cystitis with hematuria  Hypokalemia    ED Discharge Orders        Ordered    cephALEXin (KEFLEX) 500 MG capsule  2 times daily     01/14/18 1518       Lorayne Bender, PA-C 01/14/18 2028    Orlie Dakin, MD 01/15/18 [phone]

## 2018-01-15 LAB — URINE CULTURE: Culture: NO GROWTH

## 2018-01-16 DIAGNOSIS — I10 Essential (primary) hypertension: Secondary | ICD-10-CM | POA: Diagnosis not present

## 2018-01-16 DIAGNOSIS — E876 Hypokalemia: Secondary | ICD-10-CM | POA: Diagnosis not present

## 2018-01-16 DIAGNOSIS — R413 Other amnesia: Secondary | ICD-10-CM | POA: Diagnosis not present

## 2018-01-19 ENCOUNTER — Encounter (HOSPITAL_BASED_OUTPATIENT_CLINIC_OR_DEPARTMENT_OTHER): Payer: Self-pay | Admitting: *Deleted

## 2018-01-19 ENCOUNTER — Emergency Department (HOSPITAL_BASED_OUTPATIENT_CLINIC_OR_DEPARTMENT_OTHER): Payer: Medicare HMO

## 2018-01-19 ENCOUNTER — Other Ambulatory Visit: Payer: Self-pay

## 2018-01-19 ENCOUNTER — Emergency Department (HOSPITAL_BASED_OUTPATIENT_CLINIC_OR_DEPARTMENT_OTHER)
Admission: EM | Admit: 2018-01-19 | Discharge: 2018-01-19 | Disposition: A | Discharge status: Home / Self Care | Payer: Medicare HMO | Attending: Emergency Medicine | Admitting: Emergency Medicine

## 2018-01-19 DIAGNOSIS — N3000 Acute cystitis without hematuria: Secondary | ICD-10-CM

## 2018-01-19 DIAGNOSIS — R21 Rash and other nonspecific skin eruption: Secondary | ICD-10-CM | POA: Diagnosis not present

## 2018-01-19 DIAGNOSIS — I10 Essential (primary) hypertension: Secondary | ICD-10-CM | POA: Diagnosis not present

## 2018-01-19 DIAGNOSIS — T7840XA Allergy, unspecified, initial encounter: Secondary | ICD-10-CM | POA: Diagnosis not present

## 2018-01-19 DIAGNOSIS — Z79899 Other long term (current) drug therapy: Secondary | ICD-10-CM | POA: Insufficient documentation

## 2018-01-19 DIAGNOSIS — R490 Dysphonia: Secondary | ICD-10-CM | POA: Diagnosis not present

## 2018-01-19 LAB — CBC WITH DIFFERENTIAL/PLATELET
BASOS ABS: 0 10*3/uL (ref 0.0–0.1)
BASOS PCT: 0 %
EOS ABS: 0.2 10*3/uL (ref 0.0–0.7)
Eosinophils Relative: 2 %
HCT: 41 % (ref 36.0–46.0)
Hemoglobin: 14.4 g/dL (ref 12.0–15.0)
Lymphocytes Relative: 26 %
Lymphs Abs: 2.6 10*3/uL (ref 0.7–4.0)
MCH: 31.1 pg (ref 26.0–34.0)
MCHC: 35.1 g/dL (ref 30.0–36.0)
MCV: 88.6 fL (ref 78.0–100.0)
MONO ABS: 0.5 10*3/uL (ref 0.1–1.0)
Monocytes Relative: 5 %
NEUTROS ABS: 6.5 10*3/uL (ref 1.7–7.7)
Neutrophils Relative %: 67 %
Platelets: 273 10*3/uL (ref 150–400)
RBC: 4.63 MIL/uL (ref 3.87–5.11)
RDW: 13.9 % (ref 11.5–15.5)
WBC: 9.8 10*3/uL (ref 4.0–10.5)

## 2018-01-19 LAB — URINALYSIS, MICROSCOPIC (REFLEX)

## 2018-01-19 LAB — URINALYSIS, ROUTINE W REFLEX MICROSCOPIC
Bilirubin Urine: NEGATIVE
Glucose, UA: NEGATIVE mg/dL
HGB URINE DIPSTICK: NEGATIVE
Ketones, ur: 15 mg/dL — AB
NITRITE: NEGATIVE
PH: 6 (ref 5.0–8.0)
Protein, ur: NEGATIVE mg/dL
SPECIFIC GRAVITY, URINE: 1.02 (ref 1.005–1.030)

## 2018-01-19 LAB — COMPREHENSIVE METABOLIC PANEL
ALT: 27 U/L (ref 14–54)
AST: 38 U/L (ref 15–41)
Albumin: 3.4 g/dL — ABNORMAL LOW (ref 3.5–5.0)
Alkaline Phosphatase: 64 U/L (ref 38–126)
Anion gap: 11 (ref 5–15)
BUN: 31 mg/dL — AB (ref 6–20)
CHLORIDE: 107 mmol/L (ref 101–111)
CO2: 22 mmol/L (ref 22–32)
CREATININE: 1.11 mg/dL — AB (ref 0.44–1.00)
Calcium: 8.9 mg/dL (ref 8.9–10.3)
GFR calc Af Amer: 52 mL/min — ABNORMAL LOW (ref >60)
GFR, EST NON AFRICAN AMERICAN: 45 mL/min — AB (ref >60)
Glucose, Bld: 107 mg/dL — ABNORMAL HIGH (ref 65–99)
POTASSIUM: 3.8 mmol/L (ref 3.5–5.1)
SODIUM: 140 mmol/L (ref 135–145)
Total Bilirubin: 0.8 mg/dL (ref 0.3–1.2)
Total Protein: 6.5 g/dL (ref 6.5–8.1)

## 2018-01-19 MED ORDER — DIPHENHYDRAMINE HCL 25 MG PO CAPS
25.0000 mg | ORAL_CAPSULE | Freq: Once | ORAL | Status: AC
  Administered 2018-01-19: 25 mg via ORAL
  Filled 2018-01-19: qty 1

## 2018-01-19 MED ORDER — FOSFOMYCIN TROMETHAMINE 3 G PO PACK
3.0000 g | PACK | Freq: Once | ORAL | Status: AC
Start: 2018-01-19 — End: 2018-01-19
  Administered 2018-01-19: 3 g via ORAL
  Filled 2018-01-19: qty 3

## 2018-01-19 MED ORDER — DEXAMETHASONE 6 MG PO TABS
10.0000 mg | ORAL_TABLET | Freq: Once | ORAL | Status: AC
  Administered 2018-01-19: 10 mg via ORAL
  Filled 2018-01-19: qty 1

## 2018-01-19 MED ORDER — FAMOTIDINE 20 MG PO TABS
20.0000 mg | ORAL_TABLET | Freq: Once | ORAL | Status: AC
  Administered 2018-01-19: 20 mg via ORAL
  Filled 2018-01-19: qty 1

## 2018-01-19 MED ORDER — SODIUM CHLORIDE 0.9 % IV BOLUS
1000.0000 mL | Freq: Once | INTRAVENOUS | Status: AC
  Administered 2018-01-19: 1000 mL via INTRAVENOUS

## 2018-01-19 NOTE — ED Triage Notes (Signed)
Patient was prescribed with Cephalexin last Tuesday for UTI.  Hives started showing worst yesterday.

## 2018-01-19 NOTE — ED Provider Notes (Signed)
[facility] EMERGENCY DEPARTMENT Provider Note   CSN: [id number] Arrival date & time: 01/19/18  3825     History   Chief Complaint Chief Complaint  Patient presents with  . Rash    hives all over    HPI Alexandria Murphy is a 84 y.o. female.  84 yo F with a chief complaint of generalized weakness.  This been going on for the past week.  She was seen earlier in the week in the emergency department and found to have a potassium of 2.6 as well as a possible urinary tract infection.  She was started on Keflex and discharged home.  Since then the patient is unable to say if her generalized weakness has improved however she did develop hives after taking Keflex.  She denies any shortness of breath any feeling of throat is closing she denies any worsening lightheaded or dizziness denies vomiting or diarrhea.  She has had a mild cough since then.  Denies any urinary symptoms.  Denies any chest pain or shortness of breath.  The patient has some component of dementia.  When I asked the family why she was initially brought in they said because she was not as peppy as she normally is.  The history is provided by the patient and a relative.  Rash    Illness  This is a new problem. The current episode started more than 1 week ago. The problem occurs constantly. The problem has not changed since onset.Pertinent negatives include no chest pain, no abdominal pain, no headaches and no shortness of breath. Nothing aggravates the symptoms. Nothing relieves the symptoms. She has tried nothing for the symptoms. The treatment provided no relief.    Past Medical History:  Diagnosis Date  . Colitis   . Hypertension   . Thyroid disease     There are no active problems to display for this patient.   History reviewed. No pertinent surgical history.   OB History   None      Home Medications    Prior to Admission medications   Medication Sig Start Date End Date Taking? Authorizing  Provider  cephALEXin (KEFLEX) 500 MG capsule Take 1 capsule (500 mg total) by mouth 2 (two) times daily for 5 days. 01/14/18 01/19/18 Yes Joy, Shawn C, PA-C  levothyroxine (SYNTHROID, LEVOTHROID) 100 MCG tablet Take 100 mcg by mouth daily before breakfast.   Yes [provider]  lisinopril (PRINIVIL,ZESTRIL) 40 MG tablet Take 40 mg by mouth daily.   Yes [provider]  hydrochlorothiazide (HYDRODIURIL) 25 MG tablet Take 25 mg by mouth daily.    [provider]    Family History History reviewed. No pertinent family history.  Social History Social History   Tobacco Use  . Smoking status: Never Smoker  . Smokeless tobacco: Never Used  Substance Use Topics  . Alcohol use: Not on file  . Drug use: Not on file     Allergies   Cephalexin; Entocort ec [budesonide]; and Lipitor [atorvastatin]   Review of Systems Review of Systems  Constitutional: Negative for chills and fever.  HENT: Negative for congestion and rhinorrhea.   Eyes: Negative for redness and visual disturbance.  Respiratory: Negative for shortness of breath and wheezing.   Cardiovascular: Negative for chest pain and palpitations.  Gastrointestinal: Negative for abdominal pain, nausea and vomiting.  Genitourinary: Negative for dysuria and urgency.  Musculoskeletal: Negative for arthralgias and myalgias.  Skin: Positive for rash. Negative for pallor and wound.  Neurological:  Positive for weakness (generalized). Negative for dizziness and headaches.     Physical Exam Updated Vital Signs BP 103/60 (BP Location: Right Arm)   Pulse 70   Temp 98.1 F (36.7 C) (Oral)   Resp 18   Ht 5' (1.524 m)   Wt 49.9 kg (110 lb)   SpO2 97%   BMI 21.48 kg/m   Physical Exam  Constitutional: She is oriented to person, place, and time. She appears well-developed and well-nourished. No distress.  HENT:  Head: Normocephalic and atraumatic.  Eyes: Pupils are equal, round, and reactive to light. EOM are  normal.  Neck: Normal range of motion. Neck supple.  Cardiovascular: Normal rate and regular rhythm. Exam reveals no gallop and no friction rub.  No murmur heard. Pulmonary/Chest: Effort normal. She has no wheezes. She has no rales.  Abdominal: Soft. She exhibits no distension. There is no tenderness.  Musculoskeletal: She exhibits no edema or tenderness.  Neurological: She is alert and oriented to person, place, and time.  Skin: Skin is warm and dry. She is not diaphoretic.  Psychiatric: She has a normal mood and affect. Her behavior is normal.  Nursing note and vitals reviewed.    ED Treatments / Results  Labs (all labs ordered are listed, but only abnormal results are displayed) Labs Reviewed  COMPREHENSIVE METABOLIC PANEL - Abnormal; Notable for the following components:      Result Value   Glucose, Bld 107 (*)    BUN 31 (*)    Creatinine, Ser 1.11 (*)    Albumin 3.4 (*)    GFR calc non Af Amer 45 (*)    GFR calc Af Amer 52 (*)    All other components within normal limits  URINALYSIS, ROUTINE W REFLEX MICROSCOPIC - Abnormal; Notable for the following components:   APPearance CLOUDY (*)    Ketones, ur 15 (*)    Leukocytes, UA TRACE (*)    All other components within normal limits  URINALYSIS, MICROSCOPIC (REFLEX) - Abnormal; Notable for the following components:   Bacteria, UA MANY (*)    All other components within normal limits  CBC WITH DIFFERENTIAL/PLATELET    EKG EKG Interpretation  Date/Time:  Sunday Jan 19 2018 10:03:12 EDT Ventricular Rate:  71 PR Interval:    QRS Duration: 85 QT Interval:  396 QTC Calculation: 431 R Axis:   64 Text Interpretation:  Sinus rhythm No significant change since last tracing Confirmed by Deno Etienne [phone]) on 01/19/2018 10:26:49 AM   Radiology Dg Chest 2 View  Result Date: 01/19/2018 CLINICAL DATA:  Hoarseness.  Hives. EXAM: CHEST - 2 VIEW COMPARISON:  01/08/2005 FINDINGS: Cardiomediastinal silhouette is normal. Mediastinal  contours appear intact. Calcific atherosclerotic disease of the aorta. There is no evidence of focal airspace consolidation, pleural effusion or pneumothorax. Osseous structures are without acute abnormality. Soft tissues are grossly normal. IMPRESSION: No active cardiopulmonary disease. Calcific atherosclerotic disease of the aorta. Electronically Signed   By: Fidela Salisbury M.D.   On: 01/19/2018 10:10    Procedures Procedures (including critical care time)  Medications Ordered in ED Medications  sodium chloride 0.9 % bolus 1,000 mL (0 mLs Intravenous Stopped 01/19/18 1113)  dexamethasone (DECADRON) tablet 10 mg (10 mg Oral Given 01/19/18 0957)  diphenhydrAMINE (BENADRYL) capsule 25 mg (25 mg Oral Given 01/19/18 0957)  famotidine (PEPCID) tablet 20 mg (20 mg Oral Given 01/19/18 0957)  fosfomycin (MONUROL) packet 3 g (3 g Oral Given 01/19/18 1230)     Initial Impression / Assessment  and Plan / ED Course  I have reviewed the triage vital signs and the nursing notes.  Pertinent labs & imaging results that were available during my care of the patient were reviewed by me and considered in my medical decision making (see chart for details).     84 yo F with a chief complaint of generalized weakness.  Patient does not feel that this is improved since her prior ED visit about a week ago.  Will repeat labs give a fluid bolus check a chest x-ray recheck a urine.  I feel that the Keflex likely needs to be discontinued as she has had hives with it.  No second system to warrant treatment with epinephrine.  Her blood pressure is a little lower than her prior visit though still over 854 systolic.  Feeling much better on reassessment after IV fluids.  Urine with too numerous to count bacteria 11-20 whites.  This is similar to her prior UA.  I will stop her Keflex give a dose of fosfomycin.  PCP follow-up.  1:26 PM:  I have discussed the diagnosis/risks/treatment options with the patient and family and  believe the pt to be eligible for discharge home to follow-up with PCP. We also discussed returning to the ED immediately if new or worsening sx occur. We discussed the sx which are most concerning (e.g., sudden worsening pain, fever, inability to tolerate by mouth) that necessitate immediate return. Medications administered to the patient during their visit and any new prescriptions provided to the patient are listed below.  Medications given during this visit Medications  sodium chloride 0.9 % bolus 1,000 mL (0 mLs Intravenous Stopped 01/19/18 1113)  dexamethasone (DECADRON) tablet 10 mg (10 mg Oral Given 01/19/18 0957)  diphenhydrAMINE (BENADRYL) capsule 25 mg (25 mg Oral Given 01/19/18 0957)  famotidine (PEPCID) tablet 20 mg (20 mg Oral Given 01/19/18 0957)  fosfomycin (MONUROL) packet 3 g (3 g Oral Given 01/19/18 1230)     Old records reviewed recent visit with uti, ? dehydration  The patient appears reasonably screen and/or stabilized for discharge and I doubt any other medical condition or other [facility] requiring further screening, evaluation, or treatment in the ED at this time prior to discharge.    Final Clinical Impressions(s) / ED Diagnoses   Final diagnoses:  Allergic reaction, initial encounter  Acute cystitis without hematuria    ED Discharge Orders    None       Deno Etienne, DO 01/19/18 1326

## 2018-01-19 NOTE — Discharge Instructions (Signed)
Return for recurrent rash with shortness of breath vomiting diarrhea or feeling like you may pass out

## 2018-01-19 NOTE — ED Notes (Signed)
ED Provider at bedside. 

## 2018-02-05 ENCOUNTER — Encounter: Payer: Self-pay | Admitting: Neurology

## 2018-02-05 ENCOUNTER — Ambulatory Visit: Payer: Medicare HMO | Admitting: Neurology

## 2018-02-05 VITALS — BP 151/66 | HR 55 | Ht 60.0 in | Wt 108.0 lb

## 2018-02-05 DIAGNOSIS — E559 Vitamin D deficiency, unspecified: Secondary | ICD-10-CM | POA: Diagnosis not present

## 2018-02-05 DIAGNOSIS — F0280 Dementia in other diseases classified elsewhere without behavioral disturbance: Secondary | ICD-10-CM | POA: Diagnosis not present

## 2018-02-05 DIAGNOSIS — G301 Alzheimer's disease with late onset: Secondary | ICD-10-CM | POA: Diagnosis not present

## 2018-02-05 DIAGNOSIS — D329 Benign neoplasm of meninges, unspecified: Secondary | ICD-10-CM | POA: Diagnosis not present

## 2018-02-05 DIAGNOSIS — I10 Essential (primary) hypertension: Secondary | ICD-10-CM | POA: Diagnosis not present

## 2018-02-05 DIAGNOSIS — R413 Other amnesia: Secondary | ICD-10-CM | POA: Diagnosis not present

## 2018-02-05 DIAGNOSIS — E039 Hypothyroidism, unspecified: Secondary | ICD-10-CM | POA: Diagnosis not present

## 2018-02-05 DIAGNOSIS — G309 Alzheimer's disease, unspecified: Secondary | ICD-10-CM

## 2018-02-05 DIAGNOSIS — R740 Nonspecific elevation of levels of transaminase and lactic acid dehydrogenase [LDH]: Secondary | ICD-10-CM | POA: Diagnosis not present

## 2018-02-05 DIAGNOSIS — D320 Benign neoplasm of cerebral meninges: Secondary | ICD-10-CM | POA: Diagnosis not present

## 2018-02-05 MED ORDER — DONEPEZIL HCL 10 MG PO TABS: 10.0000 mg | ORAL_TABLET | Freq: Every day | ORAL | 11 refills | Status: AC

## 2018-02-05 NOTE — Patient Instructions (Addendum)
Donepezil tablets What is this medicine? DONEPEZIL (doe NEP e zil) is used to treat mild to moderate dementia caused by Alzheimer's disease. This medicine may be used for other purposes; ask your health care provider or pharmacist if you have questions. COMMON BRAND NAME(S): Aricept What should I tell my health care provider before I take this medicine? They need to know if you have any of these conditions: -asthma or other lung disease -difficulty passing urine -head injury -heart disease -history of irregular heartbeat -liver disease -seizures (convulsions) -stomach or intestinal disease, ulcers or stomach bleeding -an unusual or allergic reaction to donepezil, other medicines, foods, dyes, or preservatives -pregnant or trying to get pregnant -breast-feeding How should I use this medicine? Take this medicine by mouth with a glass of water. Follow the directions on the prescription label. You may take this medicine with or without food. Take this medicine at regular intervals. This medicine is usually taken before bedtime. Do not take it more often than directed. Continue to take your medicine even if you feel better. Do not stop taking except on your doctor's advice. If you are taking the 23 mg donepezil tablet, swallow it whole; do not cut, crush, or chew it. Talk to your pediatrician regarding the use of this medicine in children. Special care may be needed. Overdosage: If you think you have taken too much of this medicine contact a poison control center or emergency room at once. NOTE: This medicine is only for you. Do not share this medicine with others. What if I miss a dose? If you miss a dose, take it as soon as you can. If it is almost time for your next dose, take only that dose, do not take double or extra doses. What may interact with this medicine? Do not take this medicine with any of the following medications: -certain medicines for fungal infections like itraconazole,  fluconazole, posaconazole, and voriconazole -cisapride -dextromethorphan; quinidine -dofetilide -dronedarone -pimozide -quinidine -thioridazine -ziprasidone This medicine may also interact with the following medications: -antihistamines for allergy, cough and cold -atropine -bethanechol -carbamazepine -certain medicines for bladder problems like oxybutynin, tolterodine -certain medicines for Parkinson's disease like benztropine, trihexyphenidyl -certain medicines for stomach problems like dicyclomine, hyoscyamine -certain medicines for travel sickness like scopolamine -dexamethasone -ipratropium -NSAIDs, medicines for pain and inflammation, like ibuprofen or naproxen -other medicines for Alzheimer's disease -other medicines that prolong the QT interval (cause an abnormal heart rhythm) -phenobarbital -phenytoin -rifampin, rifabutin or rifapentine This list may not describe all possible interactions. Give your health care provider a list of all the medicines, herbs, non-prescription drugs, or dietary supplements you use. Also tell them if you smoke, drink alcohol, or use illegal drugs. Some items may interact with your medicine. What should I watch for while using this medicine? Visit your doctor or health care professional for regular checks on your progress. Check with your doctor or health care professional if your symptoms do not get better or if they get worse. You may get drowsy or dizzy. Do not drive, use machinery, or do anything that needs mental alertness until you know how this drug affects you. What side effects may I notice from receiving this medicine? Side effects that you should report to your doctor or health care professional as soon as possible: -allergic reactions like skin rash, itching or hives, swelling of the face, lips, or tongue -feeling faint or lightheaded, falls -loss of bladder control -seizures -signs and symptoms of a dangerous change in heartbeat or  heart   rhythm like chest pain; dizziness; fast or irregular heartbeat; palpitations; feeling faint or lightheaded, falls; breathing problems -signs and symptoms of infection like fever or chills; cough; sore throat; pain or trouble passing urine -signs and symptoms of liver injury like dark yellow or brown urine; general ill feeling or flu-like symptoms; light-colored stools; loss of appetite; nausea; right upper belly pain; unusually weak or tired; yellowing of the eyes or skin -slow heartbeat or palpitations -unusual bleeding or bruising -vomiting Side effects that usually do not require medical attention (report to your doctor or health care professional if they continue or are bothersome): -diarrhea, especially when starting treatment -headache -loss of appetite -muscle cramps -nausea -stomach upset This list may not describe all possible side effects. Call your doctor for medical advice about side effects. You may report side effects to FDA at 1-800-FDA-1088. Where should I keep my medicine? Keep out of reach of children. Store at room temperature between 15 and 30 degrees C (59 and 86 degrees F). Throw away any unused medicine after the expiration date. NOTE: This sheet is a summary. It may not cover all possible information. If you have questions about this medicine, talk to your doctor, pharmacist, or health care provider.  2018 Elsevier/Gold Standard (2016-01-30 21:00:42)   Recommendations to prevent or slow progression of cognitive decline:   Exercise You should increase exercise 30 to 45 minutes per day at least 3 days a week although 5 to 7 would be preferred. Any type of exercise (including walking) is acceptable although a recumbent bicycle may be best if you are unsteady. Disease related apathy can be a significant roadblock to exercise and the only way to overcome this is to make it a daily routine and perhaps have a reward at the end (something your loved one loves to eat or  drink perhaps) or a personal trainer coming to the home can also be very useful. In general a structured, repetitive schedule is best.   Cardiovascular Health: You should optimize all cardiovascular risk factors (blood pressure, sugar, cholesterol) as vascular disease such as strokes and heart attacks can make memory problems much worse.   Diet: Eating a heart healthy (Mediterranean) diet is also a good idea; fish and poultry instead of red meat, nuts (mostly non-peanuts), vegetables, fruits, olive oil or canola oil (instead of butter), minimal salt (use other spices to flavor foods), whole grain rice, bread, cereal and pasta and wine in moderation.  General Health: Any diseases which effect your body will effect your brain such as a pneumonia, urinary infection, blood clot, heart attack or stroke. Keep contact with your primary care doctor for regular follow ups.  Sleep. A good nights sleep is healthy for the brain. Seven hours is recommended. If you have insomnia or poor sleep habits see the recommendations below  Tips: Structured and consistent daytime and nighttime routine, including regular wake times, bedtimes, and mealtimes, will be important for the patient to avoid confusion. Keeping frequently used items in designated places will help reduce stress from searching. If there are worries about getting lost do not let the patient leave home unaccompanied. They might benefit from wearing an identification bracelet that will help others assist in finding home if they become lost. Information about nationwide safe return services and other helpful resources may be obtained through the Alzheimer's Association helpline at [phone].  Finances, Power of Producer, television/film/video Directives: You should consider putting legal safeguards in place with regard to financial and medical decision making. While  the spouse always has power of attorney for medical and financial issues in the absence of any form, you  should consider what you want in case the spouse / caregiver is no longer around or capable of making decisions.   [place] : http://www.welch.com/.pdf  Or Google "[place]" AND "An Forensic scientist for [employer]  Other States: ApartmentMom.com.ee  The signature on these forms should be notarized.   DRIVING:   Driving only during the day Drive only to familiar Locations Avoid driving during bad weather  If you would like to be tested to see if you are driving safely, [facility] has a Clinical Driving Evaluation. To schedule an appointment call [phone].                RESOURCES:  Memory Loss: Improve your short term memory By Silvio Pate  The Alzheimer's Reading Room http://www.alzheimersreadingroom.com/   The Alzheimer's Compendium http://www.alzcompend.info/  [employer] www.dukefamilysupport.EXB 727-228-0286  Recommended resources for caregivers (All can be purchased on [employer]):  1) A Caregiver's Guide to Dementia: Using Activities and Other Strategies to Prevent, Reduce and Manage Behavioral Symptoms by Osie Bond. Tyler Aas and [employer]   2) A Caregiver's Guide to [employer] Dementia by Caleen Essex MS BSN and Gaston Islam   3) What If It's Not Alzheimer's?: A Caregiver's Guide to Dementia by Koren Shiver (Author), Octaviano Batty (Editor)  3) The 36 hour day by Rabins and Mace  4) Understanding Difficult Behaviors by Merita Norton and White  Online course for helping caregivers reduce stress, guilt and frustration called the Caregivers Helpbook. The website is www.powerfultoolsforcaregivers.org  As a caregiver you are a Art gallery manager. Problems you face as a caregiver are usually unique to your situation and the way your loved-one's disease manifests itself. The best way to use these  books is to look at the Table of Contents and read any chapters of interest or that apply to challenges you are having as a caregiver.  NATIONAL RESOURCES: For more information on neurological disorders or research programs funded by the [employer] of Neurological Disorders and Stroke, contact the Institute's Agricultural consultant (BRAIN) at: [place] [address] [phone] (toll-free) MasterBoxes.it  Information on dementia is also available from the following organizations: Alzheimer's Disease Education and Referral ([place]) [place] on Aging [address] [place] [address] [phone] (toll-free) DVDEnthusiasts.nl  Alzheimer's Association [address], [place] [address] [phone] (toll-free, 24-hour helpline) [phone] (TDD) CapitalMile.co.nz  Alzheimer's Foundation of America [address], [place] [address] [phone] (toll-free) www.alzfdn.org  [facility] [address], [place] [address] [phone] www.alzdiscovery.org  Association for [address], [place] [address] [phone] (toll-free) www[place] [address] [phone] (toll-free) www.brightfocus.org/alzheimers  Doran Stabler [facility] Alzheimer's [facility] [address], [place] [address] [phone] www.https://lambert-jackson.net/  Lewy Body Dementia Association [address] [place] [address] [phone] (toll-free LBD Caregiver Link) www.lbda.[place], [place], [place] [address] [phone] (toll-free) [phone] [facility]) https://carter.com/  National  Organization for Rare Disorders [address] [place] [address] 4-196-222-LNLG [phone]) (toll-free) www.rarediseases.org  The Dementias: Hope Through Research was jointly produced by the [employer] of Neurological Disorders and Stroke (NINDS) and the [employer] on Aging (  NIA), both part of the [employer], the nation's medical research agency-supporting scientific studies that turn discovery into health. NINDS is the nation's leading funder of research on the brain and nervous system. The NINDS mission is to reduce the burden of neurological disease. For more information and resources, visit MasterBoxes.it [1] or call ([phone]. NIA leads the federal government effort conducting and supporting research on aging and the health and well-being of older people. NIA's Alzheimer's Disease Education and Referral (ADEAR) Center offers information and publications on dementia and caregiving for families, caregivers, and professionals. For more information, visit DVDEnthusiasts.nl [2] or call ([phone]. Also available from NIA are publications and information about Alzheimer's disease as well as the booklets Frontotemporal Disorders: Information for Patients, Families, and Caregivers and Lewy Body Dementia: Information for Patients, Families, and Professionals. Source URL: SocialSpecialists.co.nz

## 2018-02-05 NOTE — Progress Notes (Signed)
[facility]    Provider:  Dr Jaynee Eagles Referring Provider: Jani Gravel, MD Primary Care Physician:  Jani Gravel, MD  CC:  Memory loss  HPI:  Alexandria Murphy is a 84 y.o. female here as a referral from Dr. Maudie Mercury for memory loss and meningioma.  Past medical history of hypertension, diverticulosis and lymphocytic colitis, hypothyroidism, abnormal liver function tests ultrasound negative except for liver hemangioma, memory loss. She is here with her daughter who also provides information. Reviewed images of CT head with patient. Patient says she can't rememeber stuff, short term memory loss, recently worsening and daughter is noticing it more, she is asking the same questions over and over again in the same day. Started noticing it more 8 months ago, slowly progressive. She writes the checks, she doesn't mis any bills at home but husband lets her know when the bills are due and she needs help with medications or gets to take them. She is still driving, no accidents in the last year, she is not getting lost, daughter has concerns about her driving. An older brother and an older sister have Alzheimer's dementia. She is doing well on the Aricept without side effects.  No personality changes, no problems witjh speech. No falls, no swallowing problems.No other focal neurologic deficits, associated symptoms, inciting events or modifiable factors.  Reviewed notes, labs and imaging from outside physicians, which showed:   Reviewed referring physician's notes.  She was sent here for memory disorder.  CT of the brain December 04, 2018 19th showed a 2.2 cm meningioma anterior left frontal.  Patient has tried Aricept and had some GI upset.  Still continuing to take this medication.  Reviewed exam which appeared to be normal general examination and physical examination.  CT head 12/03/2017: personally reviewed imaging. Arising from the inner cortex of the anterior left frontal calvarium is a 2.2 x 2 x 1.6 cm  calcified mass consistent with a meningioma displacing surrounding brain parenchyma but without surrounding vasogenic edema.    Review of Systems: Patient complains of symptoms per HPI as well as the following symptoms: memory loss, confusion. Pertinent negatives and positives per HPI. All others negative.   Social History   Socioeconomic History  . Marital status: Married    Spouse name: Not on file  . Number of children: 3  . Years of education: Not on file  . Highest education level: High school graduate  Occupational History  . Not on file  Social Needs  . Financial resource strain: Not on file  . Food insecurity:    Worry: Not on file    Inability: Not on file  . Transportation needs:    Medical: Not on file    Non-medical: Not on file  Tobacco Use  . Smoking status: Never Smoker  . Smokeless tobacco: Never Used  Substance and Sexual Activity  . Alcohol use: Never    Frequency: Never  . Drug use: Never  . Sexual activity: Not on file  Lifestyle  . Physical activity:    Days per week: Not on file    Minutes per session: Not on file  . Stress: Not on file  Relationships  . Social connections:    Talks on phone: Not on file    Gets together: Not on file    Attends religious service: Not on file    Active member of club or organization: Not on file    Attends meetings of clubs or organizations: Not on file  Relationship status: Not on file  . Intimate partner violence:    Fear of current or ex partner: Not on file    Emotionally abused: Not on file    Physically abused: Not on file    Forced sexual activity: Not on file  Other Topics Concern  . Not on file  Social History Narrative   Lives at home with her husband Alexandria Murphy   Right handed   Caffeine: 1 can of mtn dew daily    Family History  Problem Relation Age of Onset  . Heart attack Mother   . Heart disease Mother   . Arthritis Mother   . Other Father        killed on the job  . Throat cancer  Brother   . Other Brother        ?heart problems-couldn't pass a physical to do any high school sports  . Emphysema Brother   . Alzheimer's disease Other        runs in the family     Past Medical History:  Diagnosis Date  . Alzheimer disease   . Colitis    microscopic  . Diverticulosis   . Hypertension   . Hypothyroidism   . Kidney stones   . Liver hemangioma   . Lymphocytic colitis 02/2010  . Thyroid disease     Past Surgical History:  Procedure Laterality Date  . BREAST CYST ASPIRATION  1982   Dr. Rebekah [place]  . BREAST LUMPECTOMY Right   . EYE SURGERY Right 1950s  . FROZEN RIGHT SHOULDER Right 2006  . HYSTERECTOMY     complete per pt. PCP hx says for bleeding fibroid  . kidney stones    . UTEROSCOPY FOR NEPHROLITHIASIS  1981    Current Outpatient Medications  Medication Sig Dispense Refill  . hydrochlorothiazide (HYDRODIURIL) 25 MG tablet Take 25 mg by mouth daily.    Marland Kitchen levothyroxine (SYNTHROID, LEVOTHROID) 100 MCG tablet Take 100 mcg by mouth daily before breakfast.    . lisinopril (PRINIVIL,ZESTRIL) 40 MG tablet Take 20 mg by mouth daily.     . Omega-3 Fatty Acids (OMEGA 3 PO) Take 1 capsule by mouth 2 (two) times daily.    . Probiotic Product (PROBIOTIC PO) Take by mouth daily.    Marland Kitchen donepezil (ARICEPT) 10 MG tablet Take 1 tablet (10 mg total) by mouth at bedtime. 30 tablet 11   No current facility-administered medications for this visit.     Allergies as of 02/05/2018 - Review Complete 02/05/2018  Allergen Reaction Noted  . Cephalexin Hives 01/19/2018  . Entocort ec [budesonide] Rash and Other (See Comments) 01/14/2018  . Lipitor [atorvastatin] Rash and Other (See Comments) 01/14/2018    Vitals: BP (!) 151/66 (BP Location: Right Arm, Patient Position: Sitting)   Pulse (!) 55   Ht 5' (1.524 m)   Wt 108 lb (49 kg)   BMI 21.09 kg/m  Last Weight:  Wt Readings from Last 1 Encounters:  02/05/18 108 lb (49 kg)   Last Height:   Ht Readings from Last 1  Encounters:  02/05/18 5' (1.524 m)   Physical exam: Exam: Gen: NAD, conversant, well nourised, well groomed                     CV: RRR, no MRG. No Carotid Bruits. No peripheral edema, warm, nontender Eyes: Conjunctivae clear without exudates or hemorrhage  Neuro: Detailed Neurologic Exam  Speech:    Speech is normal; fluent and spontaneous with normal  comprehension.  Cognition:  MMSE - Mini Mental State Exam 02/05/2018  Orientation to time 2  Orientation to Place 5  Registration 3  Attention/ Calculation 2  Recall 2  Language- name 2 objects 2  Language- repeat 1  Language- follow 3 step command 3  Language- read & follow direction 1  Write a sentence 1  Copy design 1  Total score 23      The patient is oriented to person    recent and remote memory impaired;     language fluent;     Impaired attention, concentration,  fund of knowledge Cranial Nerves:    The pupils are equal, round, and reactive to light. Attempted fundoscopic exam could not visualize.  Visual fields are full to finger confrontation. Extraocular movements are intact. Trigeminal sensation is intact and the muscles of mastication are normal. The face is symmetric. The palate elevates in the midline. Hearing intact. Voice is normal. Shoulder shrug is normal. The tongue has normal motion without fasciculations.   Coordination:    Normal finger to nose and heel to shin.   Gait:    No ataxia  Motor Observation:    No asymmetry, no atrophy, and no involuntary movements noted. Tone:    Normal muscle tone.    Posture:    Posture is normal. normal erect    Strength:    Strength is V/V in the upper and lower limbs.      Sensation: intact to LT     Reflex Exam:  DTR's:    Deep tendon reflexes in the upper and lower extremities are symmetrical and normal bilaterally.   Toes:    The toes are upgoing bilaterally.   Clonus:    Clonus is absent.       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 Fhx in siblings, MMSE 23/30.   - Increase Aricept to 10mg  - Discussed in detail, provided information on Alzheimer's disease, literature and resources - No driving on the highways or after dark, can drive short distances during the day - Per Dr. Maudie Mercury she is on 5mg  Aricept, they report no side effects, will increase to 10mg  - Dr. Maudie Mercury follows her thyroid she is on Synthroid  Meningioma: continue to follow yearly  Orders Placed This Encounter  Procedures  . B12 and Folate Panel  . Methylmalonic acid, serum  . Homocysteine  . RPR  . Ambulatory referral to Neuropsychology   Meds ordered this encounter  Medications  . donepezil (ARICEPT) 10 MG tablet    Sig: Take 1 tablet (10 mg total) by mouth at bedtime.    Dispense:  30 tablet    Refill:  11   Cc: dr. Quincy Sheehan, MD  [facility] [address] [place], [place] [address]  Phone [phone] Fax [phone]

## 2018-02-07 ENCOUNTER — Encounter: Payer: Self-pay | Admitting: Psychology

## 2018-02-08 LAB — B12 AND FOLATE PANEL
Folate: >20 ng/mL (ref >3.0)
VITAMIN B 12: 1176 pg/mL (ref 232–1245)

## 2018-02-08 LAB — RPR: RPR Ser Ql: NONREACTIVE

## 2018-02-08 LAB — METHYLMALONIC ACID, SERUM: Methylmalonic Acid: 136 nmol/L (ref 0–378)

## 2018-02-08 LAB — HOMOCYSTEINE: Homocysteine: 9.5 umol/L (ref 0.0–15.0)

## 2018-02-10 ENCOUNTER — Telehealth: Payer: Self-pay | Admitting: Neurology

## 2018-02-10 NOTE — Telephone Encounter (Signed)
-----   Message from Melvenia Beam, MD sent at 02/08/2018  6:50 PM EDT ----- Labs normal thanks

## 2018-02-10 NOTE — Telephone Encounter (Signed)
Called the pt and made her aware of the lab work in normal range. Pt verbalized understanding. Pt had no questions at this time but was encouraged to call back if questions arise.

## 2018-03-12 DIAGNOSIS — H2511 Age-related nuclear cataract, right eye: Secondary | ICD-10-CM | POA: Diagnosis not present

## 2018-03-12 DIAGNOSIS — H35033 Hypertensive retinopathy, bilateral: Secondary | ICD-10-CM | POA: Diagnosis not present

## 2018-03-12 DIAGNOSIS — H53002 Unspecified amblyopia, left eye: Secondary | ICD-10-CM | POA: Diagnosis not present

## 2018-03-12 DIAGNOSIS — H524 Presbyopia: Secondary | ICD-10-CM | POA: Diagnosis not present

## 2018-03-12 DIAGNOSIS — H04123 Dry eye syndrome of bilateral lacrimal glands: Secondary | ICD-10-CM | POA: Diagnosis not present

## 2018-04-18 DIAGNOSIS — M7989 Other specified soft tissue disorders: Secondary | ICD-10-CM | POA: Diagnosis not present

## 2018-04-18 DIAGNOSIS — T63441A Toxic effect of venom of bees, accidental (unintentional), initial encounter: Secondary | ICD-10-CM | POA: Diagnosis not present

## 2018-05-07 DIAGNOSIS — Z1389 Encounter for screening for other disorder: Secondary | ICD-10-CM | POA: Diagnosis not present

## 2018-05-07 DIAGNOSIS — Z1231 Encounter for screening mammogram for malignant neoplasm of breast: Secondary | ICD-10-CM | POA: Diagnosis not present

## 2018-05-07 DIAGNOSIS — Z130 Encounter for screening for diseases of the blood and blood-forming organs and certain disorders involving the immune mechanism: Secondary | ICD-10-CM | POA: Diagnosis not present

## 2018-05-07 DIAGNOSIS — Z01419 Encounter for gynecological examination (general) (routine) without abnormal findings: Secondary | ICD-10-CM | POA: Diagnosis not present

## 2018-05-23 DIAGNOSIS — Z23 Encounter for immunization: Secondary | ICD-10-CM | POA: Diagnosis not present

## 2018-05-27 DIAGNOSIS — E559 Vitamin D deficiency, unspecified: Secondary | ICD-10-CM | POA: Diagnosis not present

## 2018-05-27 DIAGNOSIS — I10 Essential (primary) hypertension: Secondary | ICD-10-CM | POA: Diagnosis not present

## 2018-05-27 DIAGNOSIS — N390 Urinary tract infection, site not specified: Secondary | ICD-10-CM | POA: Diagnosis not present

## 2018-05-27 DIAGNOSIS — E039 Hypothyroidism, unspecified: Secondary | ICD-10-CM | POA: Diagnosis not present

## 2018-05-27 DIAGNOSIS — R413 Other amnesia: Secondary | ICD-10-CM | POA: Diagnosis not present

## 2018-05-27 DIAGNOSIS — R740 Nonspecific elevation of levels of transaminase and lactic acid dehydrogenase [LDH]: Secondary | ICD-10-CM | POA: Diagnosis not present

## 2018-06-02 DIAGNOSIS — E039 Hypothyroidism, unspecified: Secondary | ICD-10-CM | POA: Diagnosis not present

## 2018-06-02 DIAGNOSIS — Z79899 Other long term (current) drug therapy: Secondary | ICD-10-CM | POA: Diagnosis not present

## 2018-06-02 DIAGNOSIS — Z0001 Encounter for general adult medical examination with abnormal findings: Secondary | ICD-10-CM | POA: Diagnosis not present

## 2018-06-02 DIAGNOSIS — R21 Rash and other nonspecific skin eruption: Secondary | ICD-10-CM | POA: Diagnosis not present

## 2018-06-02 DIAGNOSIS — R740 Nonspecific elevation of levels of transaminase and lactic acid dehydrogenase [LDH]: Secondary | ICD-10-CM | POA: Diagnosis not present

## 2018-06-02 DIAGNOSIS — K7689 Other specified diseases of liver: Secondary | ICD-10-CM | POA: Diagnosis not present

## 2018-06-02 DIAGNOSIS — F418 Other specified anxiety disorders: Secondary | ICD-10-CM | POA: Diagnosis not present

## 2018-06-02 DIAGNOSIS — E559 Vitamin D deficiency, unspecified: Secondary | ICD-10-CM | POA: Diagnosis not present

## 2018-06-02 DIAGNOSIS — I10 Essential (primary) hypertension: Secondary | ICD-10-CM | POA: Diagnosis not present

## 2018-06-02 DIAGNOSIS — D329 Benign neoplasm of meninges, unspecified: Secondary | ICD-10-CM | POA: Diagnosis not present

## 2018-06-02 DIAGNOSIS — R413 Other amnesia: Secondary | ICD-10-CM | POA: Diagnosis not present

## 2018-06-11 DIAGNOSIS — R21 Rash and other nonspecific skin eruption: Secondary | ICD-10-CM | POA: Diagnosis not present

## 2018-06-11 DIAGNOSIS — F418 Other specified anxiety disorders: Secondary | ICD-10-CM | POA: Diagnosis not present

## 2018-06-17 DIAGNOSIS — R922 Inconclusive mammogram: Secondary | ICD-10-CM | POA: Diagnosis not present

## 2018-06-17 DIAGNOSIS — N6489 Other specified disorders of breast: Secondary | ICD-10-CM | POA: Diagnosis not present

## 2018-06-24 DIAGNOSIS — H5203 Hypermetropia, bilateral: Secondary | ICD-10-CM | POA: Diagnosis not present

## 2018-06-24 DIAGNOSIS — H52209 Unspecified astigmatism, unspecified eye: Secondary | ICD-10-CM | POA: Diagnosis not present

## 2018-06-24 DIAGNOSIS — H524 Presbyopia: Secondary | ICD-10-CM | POA: Diagnosis not present

## 2018-07-03 DIAGNOSIS — I10 Essential (primary) hypertension: Secondary | ICD-10-CM | POA: Diagnosis not present

## 2018-07-03 DIAGNOSIS — F418 Other specified anxiety disorders: Secondary | ICD-10-CM | POA: Diagnosis not present

## 2018-07-03 DIAGNOSIS — L989 Disorder of the skin and subcutaneous tissue, unspecified: Secondary | ICD-10-CM | POA: Diagnosis not present

## 2018-07-10 ENCOUNTER — Encounter: Payer: Medicare HMO | Admitting: Psychology

## 2018-07-12 DIAGNOSIS — J069 Acute upper respiratory infection, unspecified: Secondary | ICD-10-CM | POA: Diagnosis not present

## 2018-07-12 DIAGNOSIS — J00 Acute nasopharyngitis [common cold]: Secondary | ICD-10-CM | POA: Diagnosis not present

## 2018-07-29 DIAGNOSIS — I10 Essential (primary) hypertension: Secondary | ICD-10-CM | POA: Diagnosis not present

## 2018-07-29 DIAGNOSIS — R05 Cough: Secondary | ICD-10-CM | POA: Diagnosis not present

## 2018-07-29 DIAGNOSIS — J0100 Acute maxillary sinusitis, unspecified: Secondary | ICD-10-CM | POA: Diagnosis not present

## 2018-08-11 ENCOUNTER — Ambulatory Visit: Payer: Medicare HMO | Admitting: Adult Health

## 2018-08-12 ENCOUNTER — Encounter: Payer: Self-pay | Admitting: Adult Health

## 2018-08-12 ENCOUNTER — Ambulatory Visit: Payer: Medicare HMO | Admitting: Adult Health

## 2018-08-12 VITALS — BP 175/68 | HR 69 | Ht 60.0 in | Wt 106.8 lb

## 2018-08-12 DIAGNOSIS — F0280 Dementia in other diseases classified elsewhere without behavioral disturbance: Secondary | ICD-10-CM | POA: Diagnosis not present

## 2018-08-12 DIAGNOSIS — D329 Benign neoplasm of meninges, unspecified: Secondary | ICD-10-CM | POA: Diagnosis not present

## 2018-08-12 DIAGNOSIS — G301 Alzheimer's disease with late onset: Secondary | ICD-10-CM | POA: Diagnosis not present

## 2018-08-12 NOTE — Patient Instructions (Signed)
Your Plan:  Continue Aricept Memory score is stable If your symptoms worsen or you develop new symptoms please let us know.    Thank you for coming to see us at [facility]. I hope we have been able to provide you high quality care today.  You may receive a patient satisfaction survey over the next few weeks. We would appreciate your feedback and comments so that we may continue to improve ourselves and the health of our patients.  

## 2018-08-12 NOTE — Progress Notes (Signed)
PATIENT: Alexandria Murphy DOB: [birth year]  REASON FOR VISIT: follow up HISTORY FROM: patient  HISTORY OF PRESENT ILLNESS: Today 08/12/18:  Mr. Nand is an 84 year old with memory disturbance.  She returns today for follow-up.  She is here today with her husband and daughter.  She continues to live at home with her husband.  She is able to complete all ADLs independently.  She does not do much cooking.  She does not operate a motor vehicle.  Denies any trouble sleeping.  Her husband helps her with her medications.  She manages her own appointments.  She reports that her appetite is fair.  Denies any changes with her mood or behavior.  No hallucinations.  Her daughter reports that she is a little more forceful with her answers.  She remains on Aricept with no side effects.  She returns today for evaluation.  HISTORY 02/05/18: (copied from Dr. Cathren Laine note) KINJAL NEITZKE is a 84 y.o. female here as a referral from Dr. Maudie Mercury for memory loss and meningioma.  Past medical history of hypertension, diverticulosis and lymphocytic colitis, hypothyroidism, abnormal liver function tests ultrasound negative except for liver hemangioma, memory loss. She is here with her daughter who also provides information. Reviewed images of CT head with patient. Patient says she can't rememeber stuff, short term memory loss, recently worsening and daughter is noticing it more, she is asking the same questions over and over again in the same day. Started noticing it more 8 months ago, slowly progressive. She writes the checks, she doesn't mis any bills at home but husband lets her know when the bills are due and she needs help with medications or gets to take them. She is still driving, no accidents in the last year, she is not getting lost, daughter has concerns about her driving. An older brother and an older sister have Alzheimer's dementia. She is doing well on the Aricept without side effects.  No personality changes, no  problems witjh speech. No falls, no swallowing problems.No other focal neurologic deficits, associated symptoms, inciting events or modifiable factors.  Reviewed notes, labs and imaging from outside physicians, which showed:   Reviewed referring physician's notes.  She was sent here for memory disorder.  CT of the brain December 04, 2018 19th showed a 2.2 cm meningioma anterior left frontal.  Patient has tried Aricept and had some GI upset.  Still continuing to take this medication.  Reviewed exam which appeared to be normal general examination and physical examination.  CT head 12/03/2017: personally reviewed imaging. Arising from the inner cortex of the anterior left frontal calvarium is a 2.2 x 2 x 1.6 cm calcified mass consistent with a meningioma displacing surrounding brain parenchyma but without surrounding vasogenic edema.    REVIEW OF SYSTEMS: Out of a complete 14 system review of symptoms, the patient complains only of the following symptoms, and all other reviewed systems are negative.  Memory loss, diarrhea, cough, runny nose  ALLERGIES: Allergies  Allergen Reactions  . Cephalexin Hives  . Entocort Ec [Budesonide] Rash and Other (See Comments)    Elevated liver enzymes  . Lipitor [Atorvastatin] Rash and Other (See Comments)    Elevated liver enzymes    HOME MEDICATIONS: Outpatient Medications Prior to Visit  Medication Sig Dispense Refill  . busPIRone (BUSPAR) 5 MG tablet Take 5 mg by mouth as needed.    . donepezil (ARICEPT) 10 MG tablet Take 1 tablet (10 mg total) by mouth at bedtime. 30 tablet 11  .  hydrochlorothiazide (HYDRODIURIL) 25 MG tablet Take 25 mg by mouth daily.    Marland Kitchen levothyroxine (SYNTHROID, LEVOTHROID) 100 MCG tablet Take 100 mcg by mouth daily before breakfast.    . lisinopril (PRINIVIL,ZESTRIL) 40 MG tablet Take 20 mg by mouth daily.     . Omega-3 Fatty Acids (OMEGA 3 PO) Take 1 capsule by mouth 2 (two) times daily.    . Probiotic Product (PROBIOTIC PO)  Take by mouth daily.     No facility-administered medications prior to visit.     PAST MEDICAL HISTORY: Past Medical History:  Diagnosis Date  . Alzheimer disease   . Colitis    microscopic  . Diverticulosis   . Hypertension   . Hypothyroidism   . Kidney stones   . Liver hemangioma   . Lymphocytic colitis 02/2010  . Thyroid disease     PAST SURGICAL HISTORY: Past Surgical History:  Procedure Laterality Date  . BREAST CYST ASPIRATION  1982   Dr. Rebekah Chesterfield  . BREAST LUMPECTOMY Right   . EYE SURGERY Right 1950s  . FROZEN RIGHT SHOULDER Right 2006  . HYSTERECTOMY     complete per pt. PCP hx says for bleeding fibroid  . kidney stones    . UTEROSCOPY FOR NEPHROLITHIASIS  1981    FAMILY HISTORY: Family History  Problem Relation Age of Onset  . Heart attack Mother   . Heart disease Mother   . Arthritis Mother   . Other Father        killed on the job  . Throat cancer Brother   . Other Brother        ?heart problems-couldn't pass a physical to do any high school sports  . Emphysema Brother   . Alzheimer's disease Other        runs in the family     SOCIAL HISTORY: Social History   Socioeconomic History  . Marital status: Married    Spouse name: Not on file  . Number of children: 3  . Years of education: Not on file  . Highest education level: High school graduate  Occupational History  . Not on file  Social Needs  . Financial resource strain: Not on file  . Food insecurity:    Worry: Not on file    Inability: Not on file  . Transportation needs:    Medical: Not on file    Non-medical: Not on file  Tobacco Use  . Smoking status: Never Smoker  . Smokeless tobacco: Never Used  Substance and Sexual Activity  . Alcohol use: Never    Frequency: Never  . Drug use: Never  . Sexual activity: Not on file  Lifestyle  . Physical activity:    Days per week: Not on file    Minutes per session: Not on file  . Stress: Not on file  Relationships  . Social  connections:    Talks on phone: Not on file    Gets together: Not on file    Attends religious service: Not on file    Active member of club or organization: Not on file    Attends meetings of clubs or organizations: Not on file    Relationship status: Not on file  . Intimate partner violence:    Fear of current or ex partner: Not on file    Emotionally abused: Not on file    Physically abused: Not on file    Forced sexual activity: Not on file  Other Topics Concern  . Not on file  Social History Narrative   Lives at home with her husband Homer   Right handed   Caffeine: 1 can of mtn dew daily      PHYSICAL EXAM  Vitals:   08/12/18 0831  BP: (!) 175/68  Pulse: 69  Weight: 106 lb 12.8 oz (48.4 kg)  Height: 5' (1.524 m)   Body mass index is 20.86 kg/m.  MMSE - Mini Mental State Exam 08/12/2018 02/05/2018  Orientation to time 3 2  Orientation to Place 5 5  Registration 3 3  Attention/ Calculation 3 2  Recall 1 2  Language- name 2 objects 2 2  Language- repeat 0 1  Language- follow 3 step command 3 3  Language- read & follow direction 1 1  Write a sentence 1 1  Copy design 1 1  Total score 23 23     Generalized: Well developed, in no acute distress   Neurological examination  Mentation: Alert  Follows all commands speech and language fluent Cranial nerve II-XII: Pupils were equal round reactive to light. Extraocular movements were full, visual field were full on confrontational test. Facial sensation and strength were normal. Uvula tongue midline. Head turning and shoulder shrug  were normal and symmetric. Motor: The motor testing reveals 5 over 5 strength of all 4 extremities. Good symmetric motor tone is noted throughout.  Sensory: Sensory testing is intact to soft touch on all 4 extremities. No evidence of extinction is noted.  Coordination: Cerebellar testing reveals good finger-nose-finger and heel-to-shin bilaterally.  Gait and station: Gait is normal.    Reflexes: Deep tendon reflexes are symmetric and normal bilaterally.   DIAGNOSTIC DATA (LABS, IMAGING, TESTING) - I reviewed patient records, labs, notes, testing and imaging myself where available.  Lab Results  Component Value Date   WBC 9.8 01/19/2018   HGB 14.4 01/19/2018   HCT 41.0 01/19/2018   MCV 88.6 01/19/2018   PLT 273 01/19/2018      Component Value Date/Time   NA 140 01/19/2018 0948   K 3.8 01/19/2018 0948   CL 107 01/19/2018 0948   CO2 22 01/19/2018 0948   GLUCOSE 107 (H) 01/19/2018 0948   BUN 31 (H) 01/19/2018 0948   CREATININE 1.11 (H) 01/19/2018 0948   CALCIUM 8.9 01/19/2018 0948   PROT 6.5 01/19/2018 0948   ALBUMIN 3.4 (L) 01/19/2018 0948   AST 38 01/19/2018 0948   ALT 27 01/19/2018 0948   ALKPHOS 64 01/19/2018 0948   BILITOT 0.8 01/19/2018 0948   GFRNONAA 45 (L) 01/19/2018 0948   GFRAA 52 (L) 01/19/2018 0948      ASSESSMENT AND PLAN 84 y.o. year old female  has a past medical history of Alzheimer disease, Colitis, Diverticulosis, Hypertension, Hypothyroidism, Kidney stones, Liver hemangioma, Lymphocytic colitis (02/2010), and Thyroid disease. here with:  1.  Memory disturbance  The patient's memory score has remained stable.  She will continue on Aricept 10 mg at bedtime.  We will continue to monitor the memory.  We will consider repeating CT of the head to evaluate meningioma at the next visit.  She is advised that if her symptoms worsen or she develops new symptoms she should let us know.  She will follow-up in 6 months or sooner if needed.  I spent 15 minutes with the patient. 50% of this time was spent reviewing memory score   Ward Givens, MSN, NP-C 08/12/2018, 9:13 AM [facility] [address], [place], [place] [address] [phone]

## 2018-08-14 ENCOUNTER — Encounter: Payer: Medicare HMO | Admitting: Psychology

## 2018-09-04 DIAGNOSIS — R42 Dizziness and giddiness: Secondary | ICD-10-CM | POA: Diagnosis not present

## 2018-09-04 DIAGNOSIS — E039 Hypothyroidism, unspecified: Secondary | ICD-10-CM | POA: Diagnosis not present

## 2018-09-15 ENCOUNTER — Other Ambulatory Visit: Payer: Self-pay

## 2018-09-15 ENCOUNTER — Encounter (HOSPITAL_BASED_OUTPATIENT_CLINIC_OR_DEPARTMENT_OTHER): Payer: Self-pay

## 2018-09-15 ENCOUNTER — Emergency Department (HOSPITAL_BASED_OUTPATIENT_CLINIC_OR_DEPARTMENT_OTHER)
Admission: EM | Admit: 2018-09-15 | Discharge: 2018-09-16 | Disposition: A | Discharge status: Home / Self Care | Payer: Medicare HMO | Attending: Emergency Medicine | Admitting: Emergency Medicine

## 2018-09-15 DIAGNOSIS — G309 Alzheimer's disease, unspecified: Secondary | ICD-10-CM | POA: Insufficient documentation

## 2018-09-15 DIAGNOSIS — E039 Hypothyroidism, unspecified: Secondary | ICD-10-CM | POA: Diagnosis not present

## 2018-09-15 DIAGNOSIS — T783XXA Angioneurotic edema, initial encounter: Secondary | ICD-10-CM | POA: Diagnosis not present

## 2018-09-15 DIAGNOSIS — I10 Essential (primary) hypertension: Secondary | ICD-10-CM | POA: Diagnosis not present

## 2018-09-15 DIAGNOSIS — Z79899 Other long term (current) drug therapy: Secondary | ICD-10-CM | POA: Diagnosis not present

## 2018-09-15 MED ORDER — FAMOTIDINE IN NACL 20-0.9 MG/50ML-% IV SOLN
20.0000 mg | Freq: Once | INTRAVENOUS | Status: AC
  Administered 2018-09-15: 20 mg via INTRAVENOUS
  Filled 2018-09-15: qty 50

## 2018-09-15 MED ORDER — METHYLPREDNISOLONE SODIUM SUCC 125 MG IJ SOLR
125.0000 mg | Freq: Once | INTRAMUSCULAR | Status: AC
  Administered 2018-09-15: 125 mg via INTRAVENOUS
  Filled 2018-09-15: qty 2

## 2018-09-15 MED ORDER — DIPHENHYDRAMINE HCL 50 MG/ML IJ SOLN
12.5000 mg | Freq: Once | INTRAMUSCULAR | Status: AC
  Administered 2018-09-15: 25 mg via INTRAVENOUS
  Filled 2018-09-15: qty 1

## 2018-09-15 NOTE — Discharge Instructions (Signed)
You were seen in the emergency department for lower lip swelling.  This is likely angioedema or some other kind of allergic reaction.  For now we recommend that you stop your lisinopril and call your primary care doctor for follow-up.  If your lip feels like it is swelling again you can try some Benadryl.  Because the swelling can progress it is important that you return to the emergency department if any worsening.

## 2018-09-15 NOTE — ED Provider Notes (Signed)
[facility] EMERGENCY DEPARTMENT Provider Note   CSN: [id number] Arrival date & time: 09/15/18  2134     History   Chief Complaint Chief Complaint  Patient presents with  . Oral Swelling    HPI ARY RUDNICK is a 85 y.o. female.  She is presenting here with lower lip swelling that started about an hour or less prior to arrival.  She said it was entire the across the whole bottom lip.  She felt it was causing some difficulty speaking.  This is never happened before.  She denies any new medications or other exposures.  She is on lisinopril.  No vomiting or diarrhea no fevers or chills no shortness of breath or chest pain.  The history is provided by the patient.  Allergic Reaction  Presenting symptoms: swelling   Presenting symptoms: no rash   Severity:  Moderate Prior allergic episodes:  No prior episodes Context: not chemicals, not cosmetics, not dairy/milk products, not insect bite/sting, not medications, not new detergents/soaps and not nuts   Relieved by:  None tried Worsened by:  Nothing Ineffective treatments:  None tried   Past Medical History:  Diagnosis Date  . Alzheimer disease ([place])   . Colitis    microscopic  . Diverticulosis   . Hypertension   . Hypothyroidism   . Kidney stones   . Liver hemangioma   . Lymphocytic colitis 02/2010  . Thyroid disease     Patient Active Problem List   Diagnosis Date Noted  . Alzheimer disease ([place]) 02/05/2018  . Meningioma ([place]) 02/05/2018    Past Surgical History:  Procedure Laterality Date  . BREAST CYST ASPIRATION  1982   Dr. Rebekah Chesterfield  . BREAST LUMPECTOMY Right   . EYE SURGERY Right 1950s  . FROZEN RIGHT SHOULDER Right 2006  . HYSTERECTOMY     complete per pt. PCP hx says for bleeding fibroid  . kidney stones    . UTEROSCOPY FOR NEPHROLITHIASIS  1981     OB History   No obstetric history on file.      Home Medications    Prior to Admission medications   Medication Sig Start Date End Date  Taking? Authorizing Provider  busPIRone (BUSPAR) 5 MG tablet Take 5 mg by mouth as needed.    [provider]  donepezil (ARICEPT) 10 MG tablet Take 1 tablet (10 mg total) by mouth at bedtime. 02/05/18   Melvenia Beam, MD  hydrochlorothiazide (HYDRODIURIL) 25 MG tablet Take 25 mg by mouth daily.    [provider]  levothyroxine (SYNTHROID, LEVOTHROID) 100 MCG tablet Take 100 mcg by mouth daily before breakfast.    [provider]  lisinopril (PRINIVIL,ZESTRIL) 40 MG tablet Take 20 mg by mouth daily.     [provider]  Omega-3 Fatty Acids (OMEGA 3 PO) Take 1 capsule by mouth 2 (two) times daily.    [provider]  Probiotic Product (PROBIOTIC PO) Take by mouth daily.    [provider]    Family History Family History  Problem Relation Age of Onset  . Heart attack Mother   . Heart disease Mother   . Arthritis Mother   . Other Father        killed on the job  . Throat cancer Brother   . Other Brother        ?heart problems-couldn't pass a physical to do any high school sports  . Emphysema Brother   . Alzheimer's disease Other  runs in the family     Social History Social History   Tobacco Use  . Smoking status: Never Smoker  . Smokeless tobacco: Never Used  Substance Use Topics  . Alcohol use: Never    Frequency: Never  . Drug use: Never     Allergies   Cephalexin; Entocort ec [budesonide]; Lipitor [atorvastatin]; and Bee venom   Review of Systems Review of Systems  Constitutional: Negative for fever.  HENT: Positive for sore throat (sl scratchy).   Eyes: Negative for visual disturbance.  Respiratory: Negative for shortness of breath.   Cardiovascular: Negative for chest pain.  Gastrointestinal: Negative for abdominal pain.  Genitourinary: Negative for dysuria.  Musculoskeletal: Negative for neck pain.  Skin: Negative for rash.  Neurological: Positive for speech difficulty.     Physical  Exam Updated Vital Signs BP (!) 182/84 (BP Location: Right Arm)   Pulse 63   Temp 97.9 F (36.6 C) (Oral)   Resp 16   Ht 5' (1.524 m)   Wt 48.7 kg   SpO2 100%   BMI 20.97 kg/m   Physical Exam Vitals signs and nursing note reviewed.  Constitutional:      General: She is not in acute distress.    Appearance: She is well-developed.  HENT:     Head: Normocephalic and atraumatic.     Jaw: No trismus.   Eyes:     Conjunctiva/sclera: Conjunctivae normal.  Neck:     Musculoskeletal: Full passive range of motion without pain and neck supple.     Trachea: Trachea normal.  Cardiovascular:     Rate and Rhythm: Normal rate and regular rhythm.     Heart sounds: No murmur.  Pulmonary:     Effort: Pulmonary effort is normal. No respiratory distress.     Breath sounds: Normal breath sounds.  Abdominal:     Palpations: Abdomen is soft.     Tenderness: There is no abdominal tenderness.  Musculoskeletal: Normal range of motion.        General: No tenderness or signs of injury.  Skin:    General: Skin is warm and dry.     Capillary Refill: Capillary refill takes less than 2 seconds.  Neurological:     General: No focal deficit present.     Mental Status: She is alert and oriented to person, place, and time.     Cranial Nerves: No cranial nerve deficit.     Sensory: No sensory deficit.     Motor: No weakness.      ED Treatments / Results  Labs (all labs ordered are listed, but only abnormal results are displayed) Labs Reviewed - No data to display  EKG None  Radiology No results found.  Procedures Procedures (including critical care time)  Medications Ordered in ED Medications  famotidine (PEPCID) IVPB 20 mg premix (20 mg Intravenous New Bag/Given 09/15/18 2234)  methylPREDNISolone sodium succinate (SOLU-MEDROL) 125 mg/2 mL injection 125 mg (125 mg Intravenous Given 09/15/18 2232)  diphenhydrAMINE (BENADRYL) injection 12.5 mg (25 mg Intravenous Given 09/15/18 2232)      Initial Impression / Assessment and Plan / ED Course  I have reviewed the triage vital signs and the nursing notes.  Pertinent labs & imaging results that were available during my care of the patient were reviewed by me and considered in my medical decision making (see chart for details).    Patient's lip swelling is definitely improving.  She said it was her entire lower lip when it started and  when I saw her it was just the left side of the lower lip.  It is better now after some medications.  She is agreeable to stick around for another hour and just to make sure it is resolving.  Final Clinical Impressions(s) / ED Diagnoses   Final diagnoses:  Angioedema, initial encounter    ED Discharge Orders    None       Hayden Rasmussen, MD 09/15/18 2340

## 2018-09-15 NOTE — ED Triage Notes (Signed)
C/o swelling to bottom lip x 20 min-states she was unable to talk when started-denies new exposure-rx lisinopril "forever"-NAD-steady gait

## 2018-09-17 DIAGNOSIS — T783XXA Angioneurotic edema, initial encounter: Secondary | ICD-10-CM | POA: Diagnosis not present

## 2018-09-17 DIAGNOSIS — I10 Essential (primary) hypertension: Secondary | ICD-10-CM | POA: Diagnosis not present

## 2018-09-29 DIAGNOSIS — J309 Allergic rhinitis, unspecified: Secondary | ICD-10-CM | POA: Diagnosis not present

## 2019-02-05 ENCOUNTER — Telehealth: Payer: Self-pay | Admitting: *Deleted

## 2019-02-05 NOTE — Telephone Encounter (Signed)
Spoke with pt's husband Zahara Rembert (who was with pt at last ov) regarding pt's appt 6/17 @ 11:00 AM. He agreed for pt to see the NP for this visit. I scheduled pt with Amy for 11:00 same day. He provided consent to do the visit on the telephone (unable to do VV) instead of coming into the office. He expressed his concerns with coming into the office. He consented to file the visit with insurance and understands they may be responsible for charges associated with the visit. He understands the pt will receive a call at 10:30 on 6/17 to check-in followed by call from Amy at 11:00 AM. He verbalized appreciation for the call and stated he would have patient ready for the visit at 11:00.

## 2019-02-11 ENCOUNTER — Ambulatory Visit: Payer: Medicare HMO | Admitting: Neurology

## 2019-02-11 ENCOUNTER — Other Ambulatory Visit: Payer: Self-pay

## 2019-02-11 ENCOUNTER — Encounter: Payer: Self-pay | Admitting: Family Medicine

## 2019-02-11 ENCOUNTER — Ambulatory Visit (INDEPENDENT_AMBULATORY_CARE_PROVIDER_SITE_OTHER): Payer: Medicare HMO | Admitting: Family Medicine

## 2019-02-11 DIAGNOSIS — G301 Alzheimer's disease with late onset: Secondary | ICD-10-CM | POA: Diagnosis not present

## 2019-02-11 DIAGNOSIS — F0280 Dementia in other diseases classified elsewhere without behavioral disturbance: Secondary | ICD-10-CM

## 2019-02-11 NOTE — Progress Notes (Signed)
Made any corrections needed, and agree with history, physical, neuro exam,assessment and plan as stated.     Elihue Ebert, MD [facility]  

## 2019-02-11 NOTE — Progress Notes (Signed)
   PATIENT: Barnabas Lister DOB: Oct 05, 1932  REASON FOR VISIT: follow up HISTORY FROM: patient  Virtual Visit via Telephone Note  I connected with Barnabas Lister on 02/11/19 at 10:30 AM EDT by telephone and verified that I am speaking with the correct person using two identifiers.   I discussed the limitations, risks, security and privacy concerns of performing an evaluation and management service by telephone and the availability of in person appointments. I also discussed with the patient that there may be a patient responsible charge related to this service. The patient expressed understanding and agreed to proceed.   History of Present Illness:  02/11/19 KEYDI GIEL is a 85 y.o. female here today for follow up for memory loss.  She is present today with her husband,, who aids in history.  He reports that she is well.  She states since her last.  She is continuing Aricept 10 mg at bedtime and she states that her only concern is intermittent diarrhea.  She does not feel that this is severe enough to stop the change but has noticed but it has continued she denies any concerns hydration.  Is tolerating Aricept otherwise.  She does continue to drive but is always accompanied by her husband.  She denies any accidents or getting lost.  She is able to dress and bathe herself she is feeling well today.   Observations/Objective:  Generalized: Well developed, in no acute distress  Mentation: Alert oriented to time (with exception to month) , place, history taking. Follows all commands speech and language fluent   Assessment and Plan:  85 y.o. year old female  has a past medical history of Alzheimer disease ([place]), Colitis, Diverticulosis, Hypertension, Hypothyroidism, Kidney stones, Liver hemangioma, Lymphocytic colitis (02/2010), and Thyroid disease. here with    ICD-10-CM   1. Late onset Alzheimer's disease without behavioral disturbance (HCC)  G30.1    F02.80    She continues to do  well with Aricept 10 mg daily.  She does have intermittent diarrhea since this with that.  She in her husband, Homer, feel that she is stable at this point and wish to hold off on additional memory medications.  Caution with driving advised.  I have scheduled her for a six-month follow-up with me.  We will consider adding Namenda when the diarrhea is resolved.  She and Homer verbalized understanding and agreement with plan. No orders of the defined types were placed in this encounter.   No orders of the defined types were placed in this encounter.    Follow Up Instructions:  I discussed the assessment and treatment plan with the patient. The patient was provided an opportunity to ask questions and all were answered. The patient agreed with the plan and demonstrated an understanding of the instructions.   The patient was advised to call back or seek an in-person evaluation if the symptoms worsen or if the condition fails to improve as anticipated.  I provided 25 minutes of non-face-to-face time during this encounter.  Patient and her husband are located at their place of residence during teleconference.  Video conferencing due to lack of technology.  Provider is located at her place of residence.  Maryelizabeth Kaufmann, CMA helped to facilitate visit.   Debbora Presto, NP

## 2019-03-03 DIAGNOSIS — I10 Essential (primary) hypertension: Secondary | ICD-10-CM | POA: Diagnosis not present

## 2019-03-03 DIAGNOSIS — N390 Urinary tract infection, site not specified: Secondary | ICD-10-CM | POA: Diagnosis not present

## 2019-03-03 DIAGNOSIS — E039 Hypothyroidism, unspecified: Secondary | ICD-10-CM | POA: Diagnosis not present

## 2019-03-10 DIAGNOSIS — R413 Other amnesia: Secondary | ICD-10-CM | POA: Diagnosis not present

## 2019-03-10 DIAGNOSIS — L259 Unspecified contact dermatitis, unspecified cause: Secondary | ICD-10-CM | POA: Diagnosis not present

## 2019-03-10 DIAGNOSIS — E876 Hypokalemia: Secondary | ICD-10-CM | POA: Diagnosis not present

## 2019-03-10 DIAGNOSIS — I10 Essential (primary) hypertension: Secondary | ICD-10-CM | POA: Diagnosis not present

## 2019-03-10 DIAGNOSIS — F418 Other specified anxiety disorders: Secondary | ICD-10-CM | POA: Diagnosis not present

## 2019-03-10 DIAGNOSIS — E039 Hypothyroidism, unspecified: Secondary | ICD-10-CM | POA: Diagnosis not present

## 2019-03-20 DIAGNOSIS — I10 Essential (primary) hypertension: Secondary | ICD-10-CM | POA: Diagnosis not present

## 2019-03-23 IMAGING — CT CT HEAD W/O CM
1 series · 16 of 30 positions shown, 20 images · non-contrast
Comparison: None.

CLINICAL DATA: 85-year-old hypertensive female with memory loss.
Initial encounter.

EXAM:
CT HEAD WITHOUT CONTRAST
TECHNIQUE: Contiguous axial images were obtained from the base of the skull
through the vertex without intravenous contrast.

[Series 2: head w/(date) · axial · 0.46mm/px · z∈[-129,+11]mm · 16 of 32 slices shown, 20 images]
[im 2/32  brain]
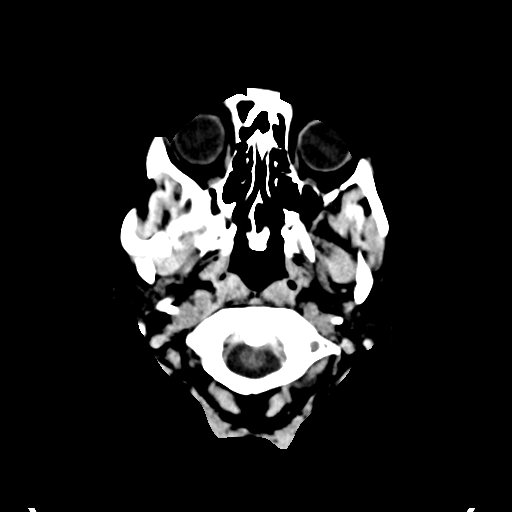
[im 2/32  bone]
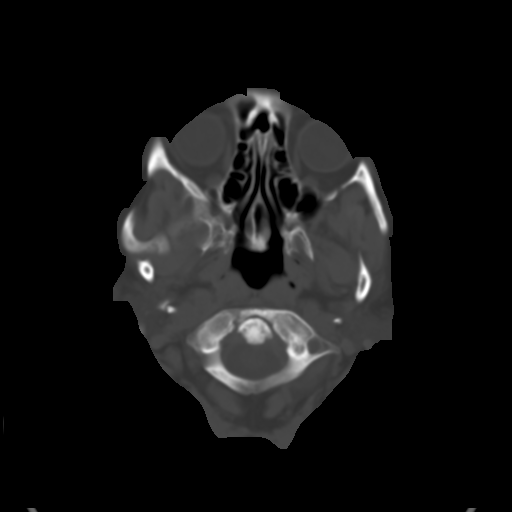
[im 4/32  brain]
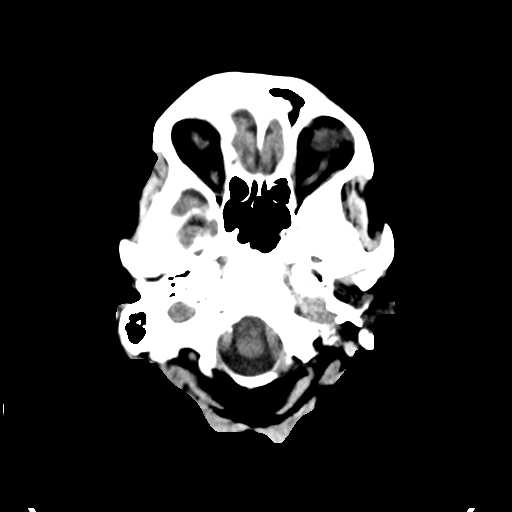
[im 6/32  brain]
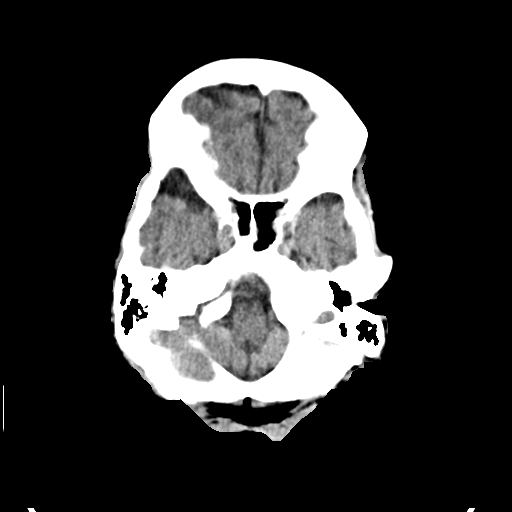
[im 8/32  brain]
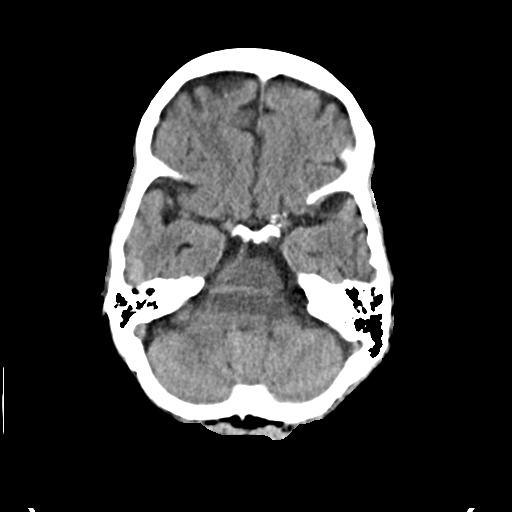
[im 9/32  brain]
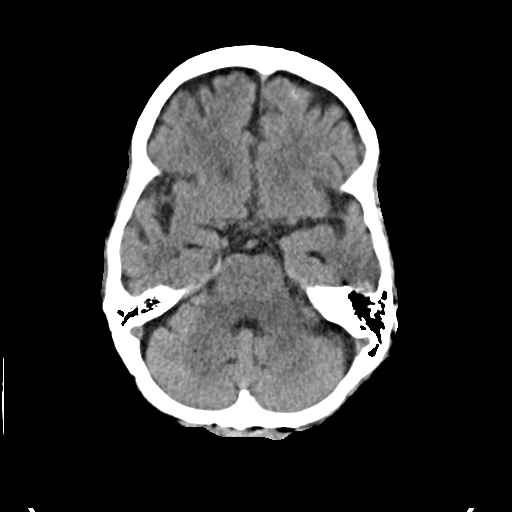
[im 9/32  bone]
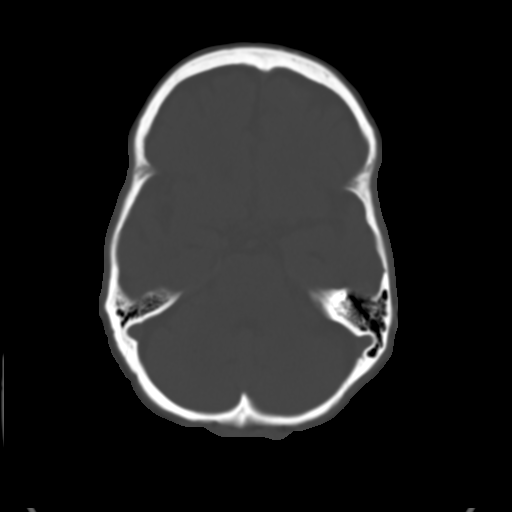
[im 11/32  brain]
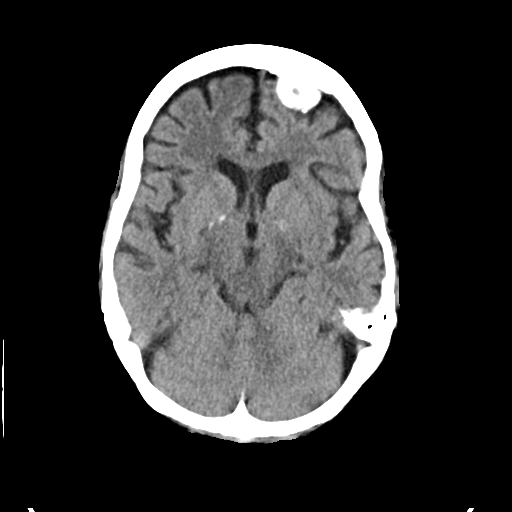
[im 13/32  brain]
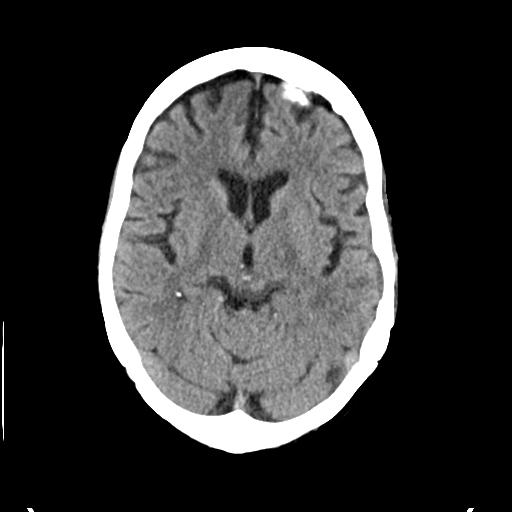
[im 15/32  brain]
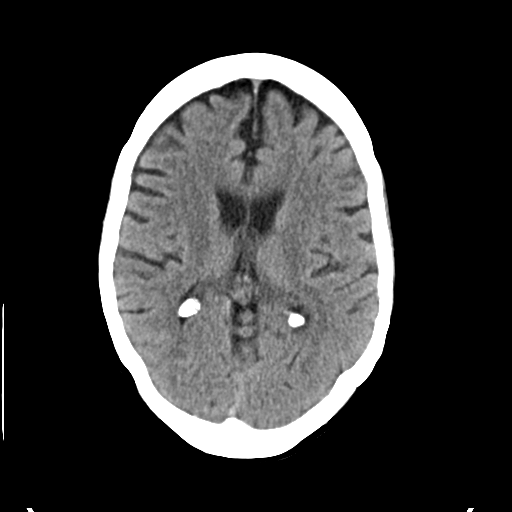
[im 17/32  brain]
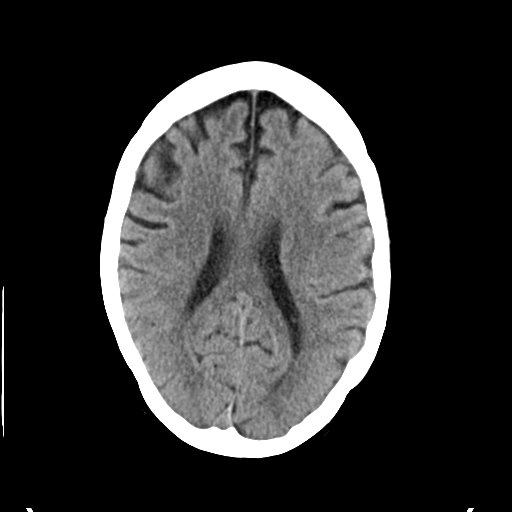
[im 17/32  bone]
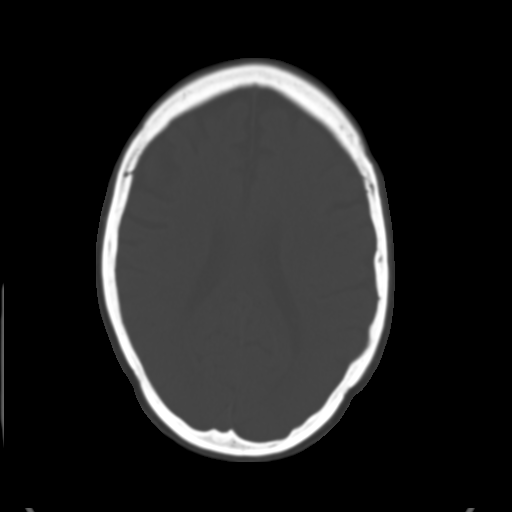
[im 19/32  brain]
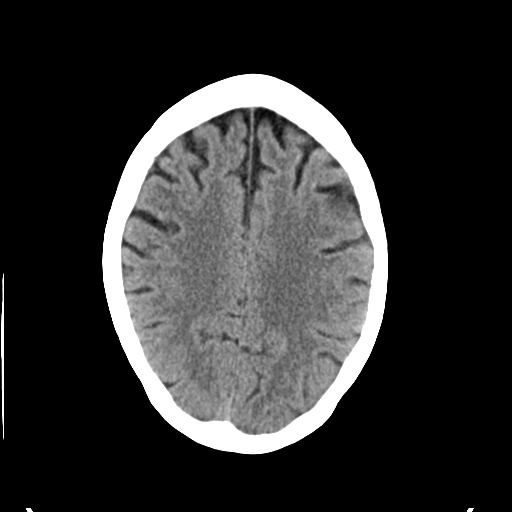
[im 21/32  brain]
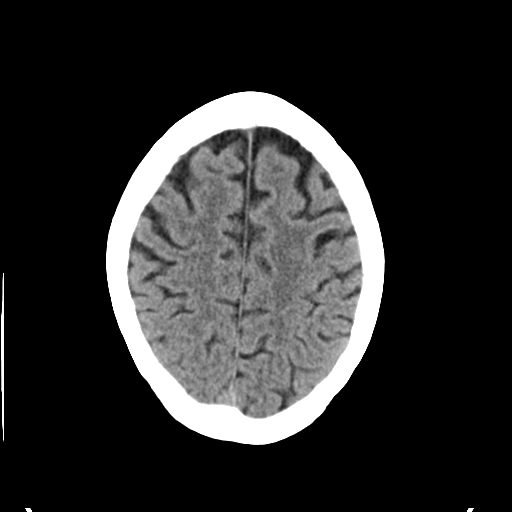
[im 23/32  brain]
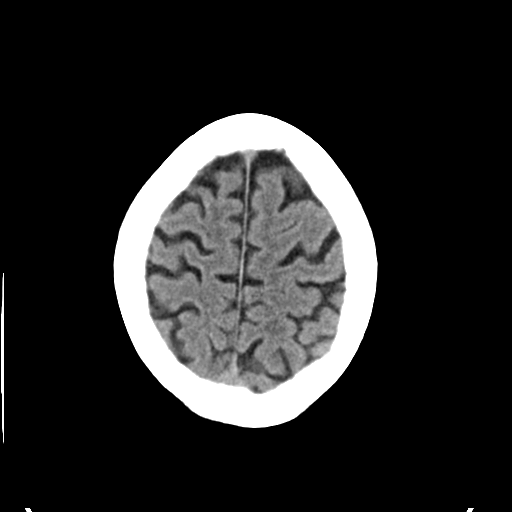
[im 24/32  brain]
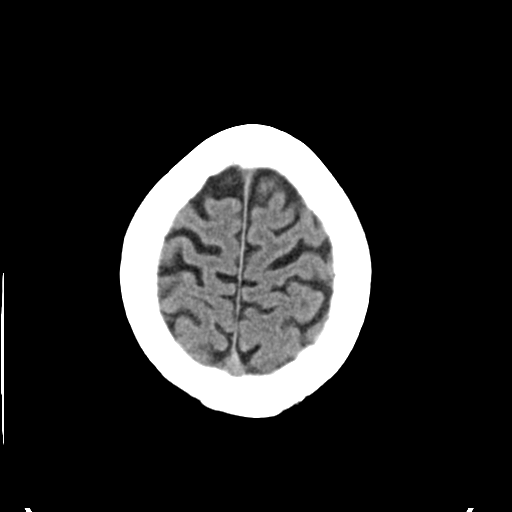
[im 24/32  bone]
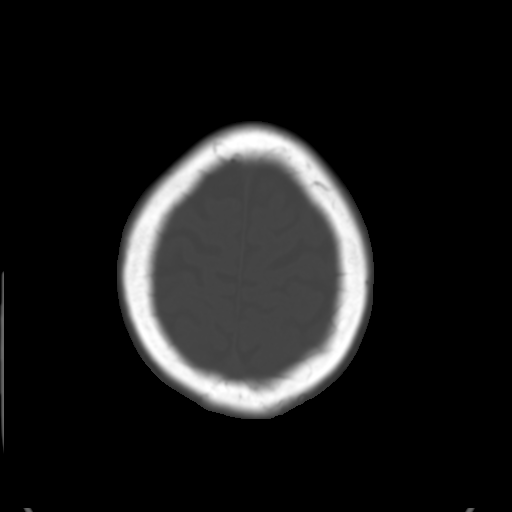
[im 26/32  brain]
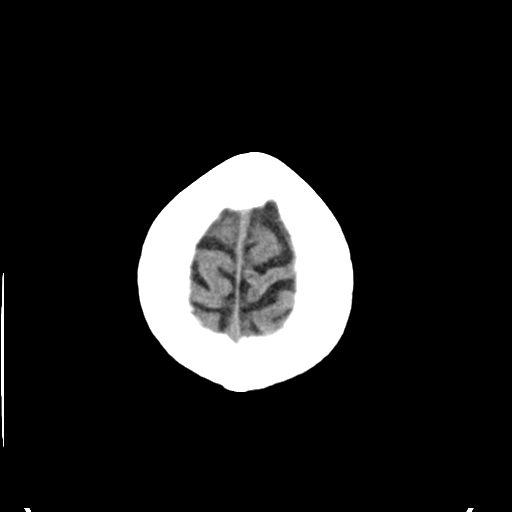
[im 28/32  brain]
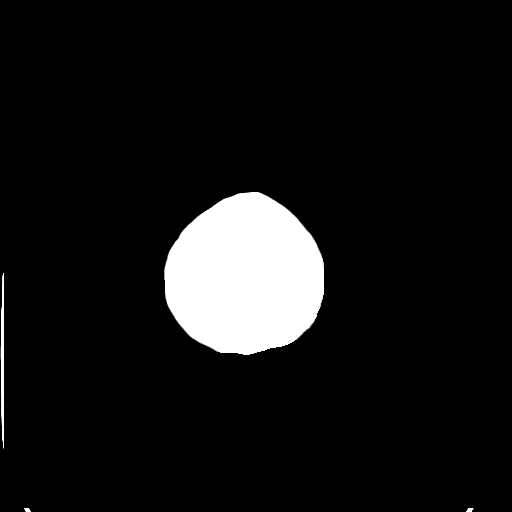
[im 30/32  brain]
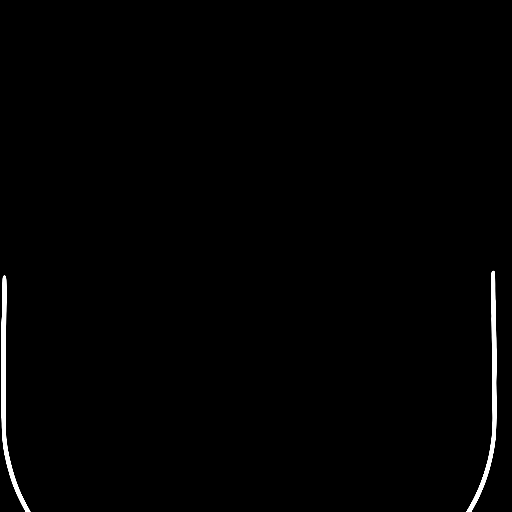

[16 of 30 positions shown; findings below may reference images not displayed]

FINDINGS: Brain: Arising from the inner cortex of the anterior left frontal
calvarium is a 2.2 x 2 x 1.6 cm calcified mass consistent with a
meningioma displacing surrounding brain parenchyma but without
surrounding vasogenic edema.

No intracranial hemorrhage or CT evidence of large acute infarct.

Chronic microvascular changes.

Mild global atrophy.

Vascular: Vascular calcifications

Skull: Hyperostosis adjacent to meningioma.

Sinuses/Orbits: Visualized orbital structures unremarkable.
Visualized paranasal sinuses are clear.

Other: Mastoid air cells and middle ear cavities are clear.
IMPRESSION: 2.2 cm anterior left frontal calcified meningioma as detailed above.

No intracranial hemorrhage or CT evidence of large acute infarct.

Mild global atrophy.

## 2019-03-31 DIAGNOSIS — F418 Other specified anxiety disorders: Secondary | ICD-10-CM | POA: Diagnosis not present

## 2019-03-31 DIAGNOSIS — E559 Vitamin D deficiency, unspecified: Secondary | ICD-10-CM | POA: Diagnosis not present

## 2019-03-31 DIAGNOSIS — E039 Hypothyroidism, unspecified: Secondary | ICD-10-CM | POA: Diagnosis not present

## 2019-03-31 DIAGNOSIS — N390 Urinary tract infection, site not specified: Secondary | ICD-10-CM | POA: Diagnosis not present

## 2019-03-31 DIAGNOSIS — I10 Essential (primary) hypertension: Secondary | ICD-10-CM | POA: Diagnosis not present

## 2019-04-07 DIAGNOSIS — R21 Rash and other nonspecific skin eruption: Secondary | ICD-10-CM | POA: Diagnosis not present

## 2019-04-07 DIAGNOSIS — R413 Other amnesia: Secondary | ICD-10-CM | POA: Diagnosis not present

## 2019-04-07 DIAGNOSIS — I10 Essential (primary) hypertension: Secondary | ICD-10-CM | POA: Diagnosis not present

## 2019-04-07 DIAGNOSIS — D329 Benign neoplasm of meninges, unspecified: Secondary | ICD-10-CM | POA: Diagnosis not present

## 2019-04-07 DIAGNOSIS — E039 Hypothyroidism, unspecified: Secondary | ICD-10-CM | POA: Diagnosis not present

## 2019-04-07 DIAGNOSIS — F418 Other specified anxiety disorders: Secondary | ICD-10-CM | POA: Diagnosis not present

## 2019-04-07 DIAGNOSIS — E559 Vitamin D deficiency, unspecified: Secondary | ICD-10-CM | POA: Diagnosis not present

## 2019-04-07 DIAGNOSIS — K7689 Other specified diseases of liver: Secondary | ICD-10-CM | POA: Diagnosis not present

## 2019-04-07 DIAGNOSIS — Z0000 Encounter for general adult medical examination without abnormal findings: Secondary | ICD-10-CM | POA: Diagnosis not present

## 2019-05-05 DIAGNOSIS — Z23 Encounter for immunization: Secondary | ICD-10-CM | POA: Diagnosis not present

## 2019-05-09 IMAGING — DX DG CHEST 2V
2 series · 2 of 2 positions shown · non-contrast
Comparison: 01/08/2005

CLINICAL DATA: Hoarseness.  Hives.

EXAM:
CHEST - 2 VIEW

[chest pa]
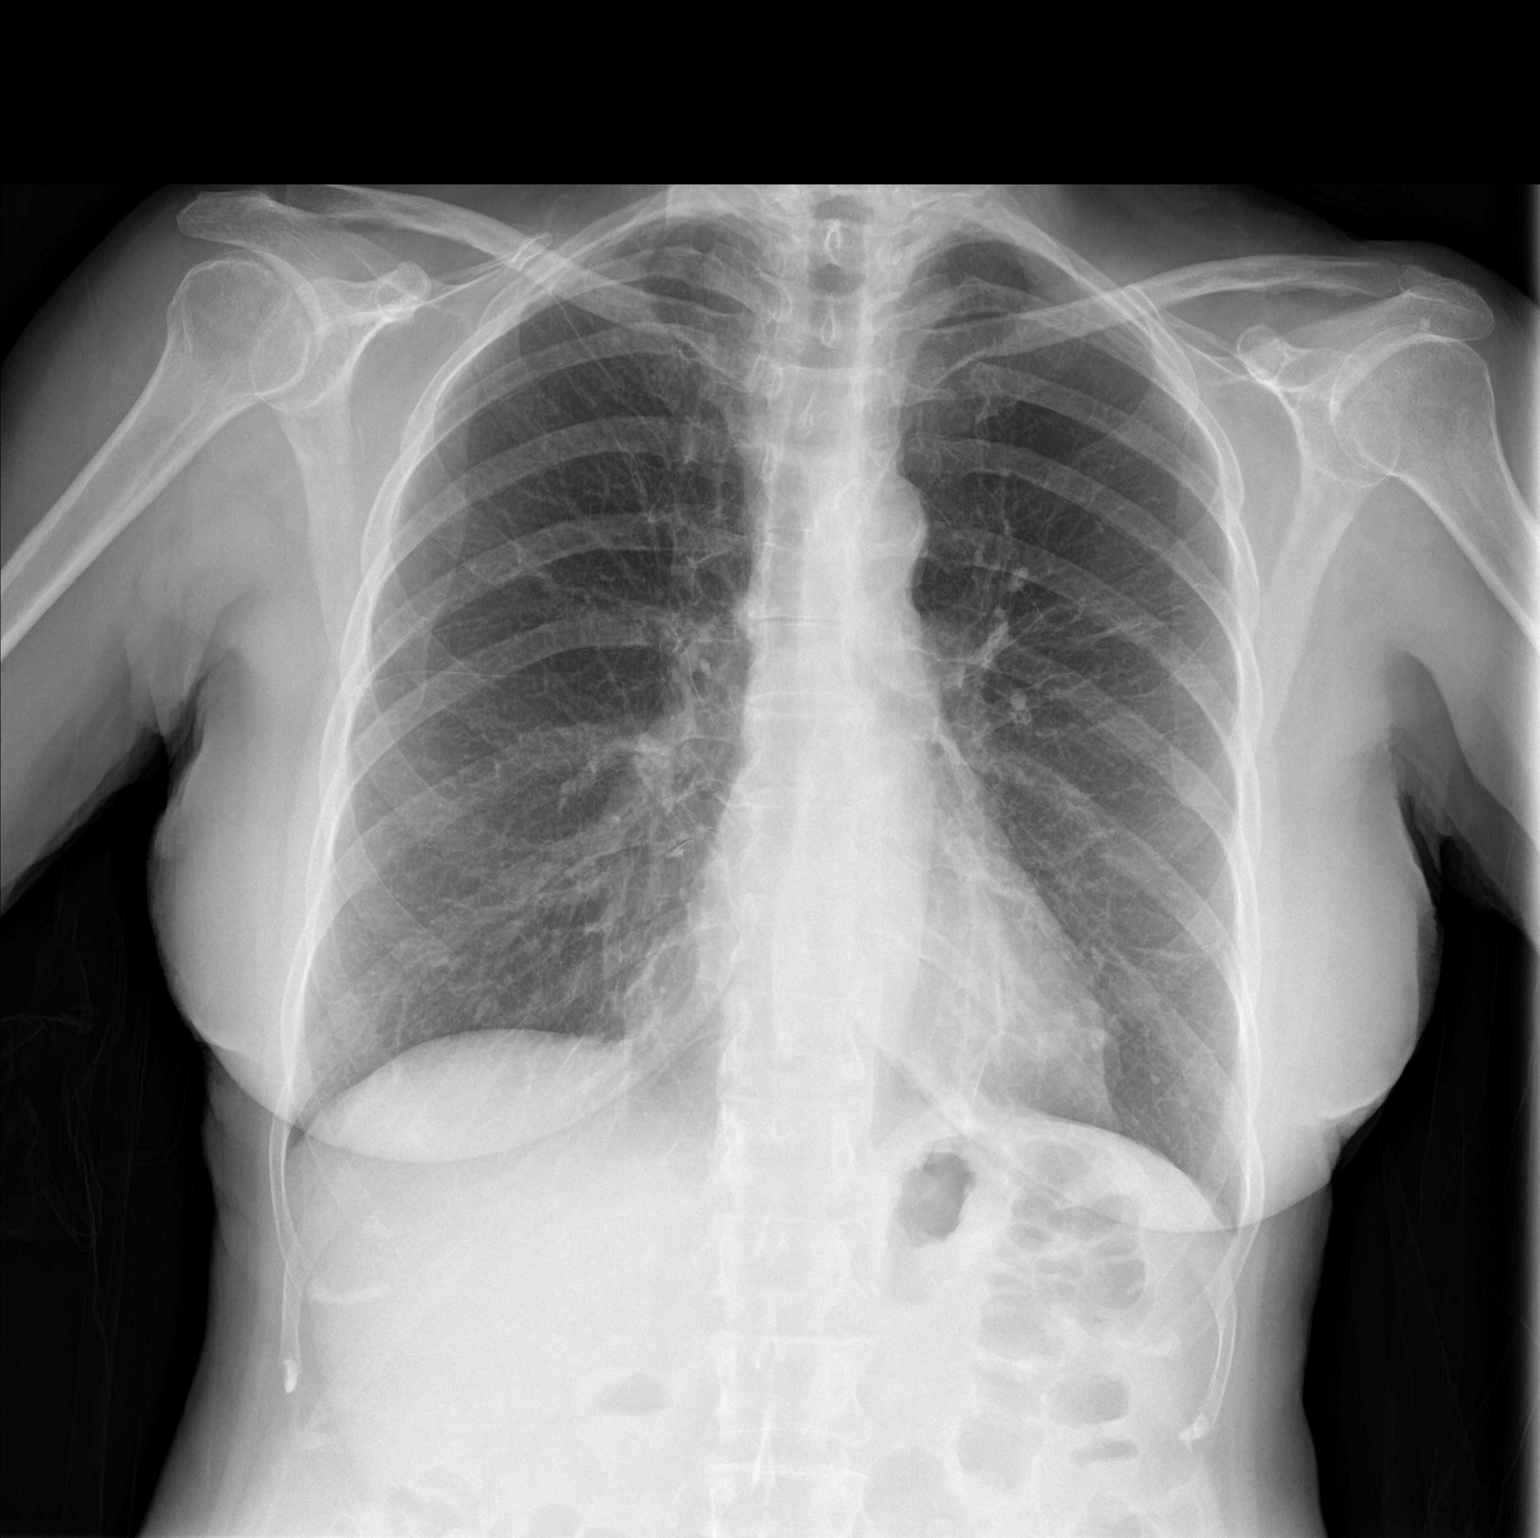

[chest lat]
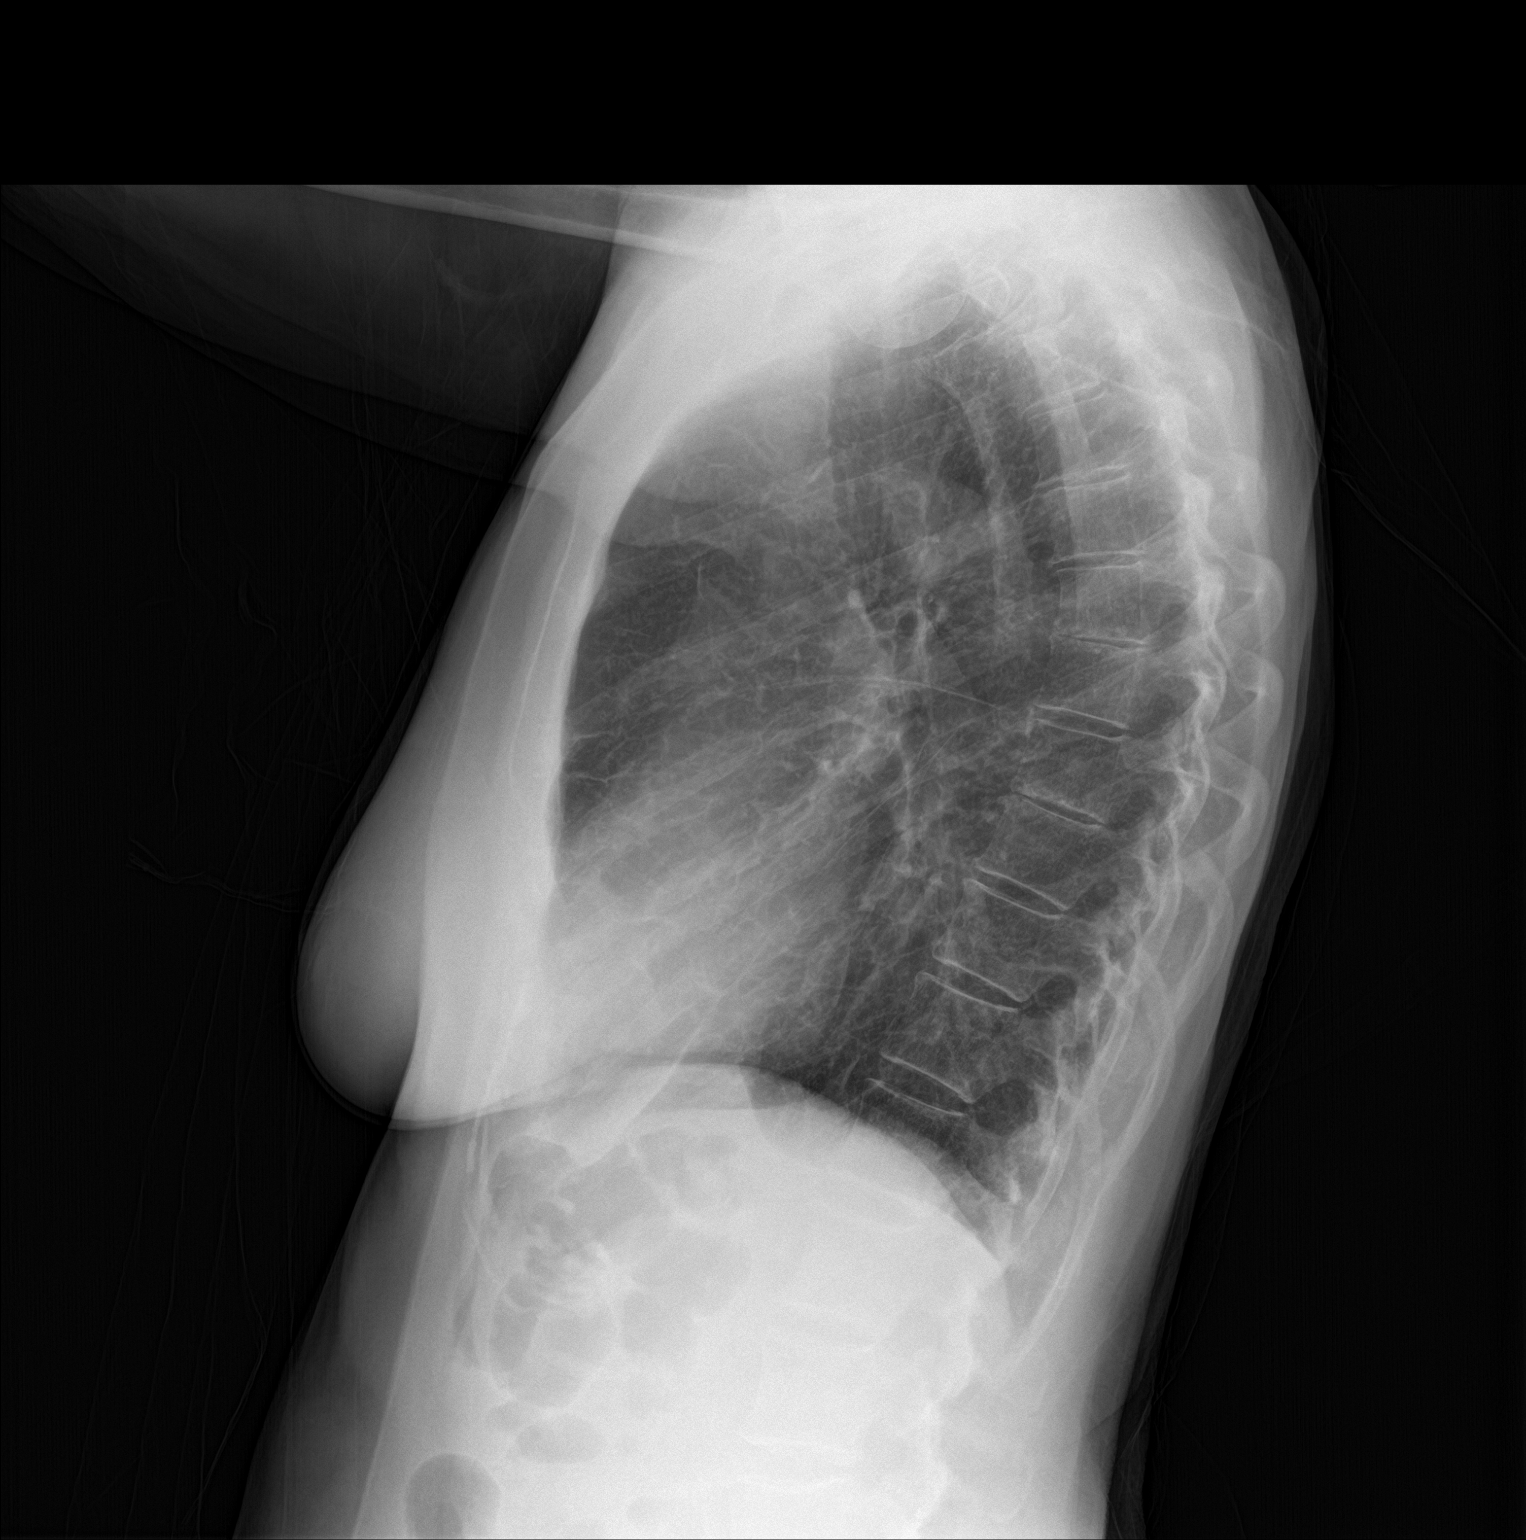

[2 of 2 positions shown; findings below may reference images not displayed]

FINDINGS: Cardiomediastinal silhouette is normal. Mediastinal contours appear
intact. Calcific atherosclerotic disease of the aorta.

There is no evidence of focal airspace consolidation, pleural
effusion or pneumothorax.

Osseous structures are without acute abnormality. Soft tissues are
grossly normal.
IMPRESSION: No active cardiopulmonary disease.

Calcific atherosclerotic disease of the aorta.

## 2019-05-11 DIAGNOSIS — Z1389 Encounter for screening for other disorder: Secondary | ICD-10-CM | POA: Diagnosis not present

## 2019-05-11 DIAGNOSIS — E669 Obesity, unspecified: Secondary | ICD-10-CM | POA: Diagnosis not present

## 2019-05-11 DIAGNOSIS — Z1231 Encounter for screening mammogram for malignant neoplasm of breast: Secondary | ICD-10-CM | POA: Diagnosis not present

## 2019-05-11 DIAGNOSIS — Z130 Encounter for screening for diseases of the blood and blood-forming organs and certain disorders involving the immune mechanism: Secondary | ICD-10-CM | POA: Diagnosis not present

## 2019-05-11 DIAGNOSIS — R8290 Unspecified abnormal findings in urine: Secondary | ICD-10-CM | POA: Diagnosis not present

## 2019-05-11 DIAGNOSIS — Z01419 Encounter for gynecological examination (general) (routine) without abnormal findings: Secondary | ICD-10-CM | POA: Diagnosis not present

## 2019-05-12 DIAGNOSIS — Z01419 Encounter for gynecological examination (general) (routine) without abnormal findings: Secondary | ICD-10-CM | POA: Diagnosis not present

## 2019-05-18 DIAGNOSIS — H5212 Myopia, left eye: Secondary | ICD-10-CM | POA: Diagnosis not present

## 2019-07-23 ENCOUNTER — Emergency Department (HOSPITAL_BASED_OUTPATIENT_CLINIC_OR_DEPARTMENT_OTHER): Payer: Medicare HMO

## 2019-07-23 ENCOUNTER — Other Ambulatory Visit: Payer: Self-pay

## 2019-07-23 ENCOUNTER — Encounter (HOSPITAL_BASED_OUTPATIENT_CLINIC_OR_DEPARTMENT_OTHER): Payer: Self-pay

## 2019-07-23 ENCOUNTER — Emergency Department (HOSPITAL_BASED_OUTPATIENT_CLINIC_OR_DEPARTMENT_OTHER)
Admission: EM | Admit: 2019-07-23 | Discharge: 2019-07-24 | Disposition: A | Discharge status: Home / Self Care | Payer: Medicare HMO | Attending: Emergency Medicine | Admitting: Emergency Medicine

## 2019-07-23 DIAGNOSIS — Z79899 Other long term (current) drug therapy: Secondary | ICD-10-CM | POA: Insufficient documentation

## 2019-07-23 DIAGNOSIS — R197 Diarrhea, unspecified: Secondary | ICD-10-CM | POA: Insufficient documentation

## 2019-07-23 DIAGNOSIS — R531 Weakness: Secondary | ICD-10-CM | POA: Diagnosis not present

## 2019-07-23 DIAGNOSIS — Z20828 Contact with and (suspected) exposure to other viral communicable diseases: Secondary | ICD-10-CM | POA: Diagnosis not present

## 2019-07-23 DIAGNOSIS — Z888 Allergy status to other drugs, medicaments and biological substances status: Secondary | ICD-10-CM | POA: Insufficient documentation

## 2019-07-23 DIAGNOSIS — R509 Fever, unspecified: Secondary | ICD-10-CM

## 2019-07-23 DIAGNOSIS — G309 Alzheimer's disease, unspecified: Secondary | ICD-10-CM | POA: Diagnosis not present

## 2019-07-23 DIAGNOSIS — I10 Essential (primary) hypertension: Secondary | ICD-10-CM | POA: Insufficient documentation

## 2019-07-23 DIAGNOSIS — Z91030 Bee allergy status: Secondary | ICD-10-CM | POA: Diagnosis not present

## 2019-07-23 DIAGNOSIS — Z881 Allergy status to other antibiotic agents status: Secondary | ICD-10-CM | POA: Insufficient documentation

## 2019-07-23 DIAGNOSIS — Z20822 Contact with and (suspected) exposure to covid-19: Secondary | ICD-10-CM

## 2019-07-23 DIAGNOSIS — E039 Hypothyroidism, unspecified: Secondary | ICD-10-CM | POA: Insufficient documentation

## 2019-07-23 DIAGNOSIS — F0280 Dementia in other diseases classified elsewhere without behavioral disturbance: Secondary | ICD-10-CM | POA: Diagnosis not present

## 2019-07-23 DIAGNOSIS — R0602 Shortness of breath: Secondary | ICD-10-CM | POA: Diagnosis not present

## 2019-07-23 LAB — COMPREHENSIVE METABOLIC PANEL
ALT: 13 U/L (ref 0–44)
AST: 38 U/L (ref 15–41)
Albumin: 4 g/dL (ref 3.5–5.0)
Alkaline Phosphatase: 62 U/L (ref 38–126)
Anion gap: 10 (ref 5–15)
BUN: 22 mg/dL (ref 8–23)
CO2: 29 mmol/L (ref 22–32)
Calcium: 9.2 mg/dL (ref 8.9–10.3)
Chloride: 95 mmol/L — ABNORMAL LOW (ref 98–111)
Creatinine, Ser: 1.1 mg/dL — ABNORMAL HIGH (ref 0.44–1.00)
GFR calc Af Amer: 53 mL/min — ABNORMAL LOW (ref >60)
GFR calc non Af Amer: 46 mL/min — ABNORMAL LOW (ref >60)
Glucose, Bld: 115 mg/dL — ABNORMAL HIGH (ref 70–99)
Potassium: 2.8 mmol/L — ABNORMAL LOW (ref 3.5–5.1)
Sodium: 134 mmol/L — ABNORMAL LOW (ref 135–145)
Total Bilirubin: 0.7 mg/dL (ref 0.3–1.2)
Total Protein: 6.9 g/dL (ref 6.5–8.1)

## 2019-07-23 LAB — CBC WITH DIFFERENTIAL/PLATELET
Abs Immature Granulocytes: 0.06 10*3/uL (ref 0.00–0.07)
Basophils Absolute: 0.1 10*3/uL (ref 0.0–0.1)
Basophils Relative: 1 %
Eosinophils Absolute: 0.3 10*3/uL (ref 0.0–0.5)
Eosinophils Relative: 3 %
HCT: 37.8 % (ref 36.0–46.0)
Hemoglobin: 12.8 g/dL (ref 12.0–15.0)
Immature Granulocytes: 1 %
Lymphocytes Relative: 26 %
Lymphs Abs: 2.7 10*3/uL (ref 0.7–4.0)
MCH: 29.8 pg (ref 26.0–34.0)
MCHC: 33.9 g/dL (ref 30.0–36.0)
MCV: 88.1 fL (ref 80.0–100.0)
Monocytes Absolute: 0.8 10*3/uL (ref 0.1–1.0)
Monocytes Relative: 8 %
Neutro Abs: 6.6 10*3/uL (ref 1.7–7.7)
Neutrophils Relative %: 61 %
Platelets: 215 10*3/uL (ref 150–400)
RBC: 4.29 MIL/uL (ref 3.87–5.11)
RDW: 12.1 % (ref 11.5–15.5)
WBC: 10.6 10*3/uL — ABNORMAL HIGH (ref 4.0–10.5)
nRBC: 0 % (ref 0.0–0.2)

## 2019-07-23 LAB — URINALYSIS, MICROSCOPIC (REFLEX)

## 2019-07-23 LAB — URINALYSIS, ROUTINE W REFLEX MICROSCOPIC
Bilirubin Urine: NEGATIVE
Glucose, UA: NEGATIVE mg/dL
Ketones, ur: NEGATIVE mg/dL
Leukocytes,Ua: NEGATIVE
Nitrite: NEGATIVE
Protein, ur: NEGATIVE mg/dL
Specific Gravity, Urine: >1.03 — ABNORMAL HIGH (ref 1.005–1.030)
pH: 6 (ref 5.0–8.0)

## 2019-07-23 LAB — LIPASE, BLOOD: Lipase: 42 U/L (ref 11–51)

## 2019-07-23 LAB — SARS CORONAVIRUS 2 AG (30 MIN TAT): SARS Coronavirus 2 Ag: NEGATIVE

## 2019-07-23 MED ORDER — ACETAMINOPHEN 325 MG PO TABS
650.0000 mg | ORAL_TABLET | Freq: Once | ORAL | Status: AC
  Administered 2019-07-23: 650 mg via ORAL
  Filled 2019-07-23: qty 2

## 2019-07-23 NOTE — ED Provider Notes (Signed)
[facility] Provider Note   CSN: [id number] Arrival date & time: 07/23/19  2044     History   Chief Complaint Chief Complaint  Patient presents with  . Fever    HPI Alexandria Murphy is a 85 y.o. female.  Presents emerge department with chief complaint fever.  Reports has had chills, fever throughout the day today.  Fever up to 102 at home.  Has not taken any medication for this.  Also states that she is been feeling somewhat weak, generalized malaise for the past couple days.  Has had loose stools over the past couple days as well.  Not watery, nonbloody.  No associated abdominal pain.  No cough, difficulty breathing, no dysuria.     HPI  Past Medical History:  Diagnosis Date  . Alzheimer disease ([place])   . Colitis    microscopic  . Diverticulosis   . Hypertension   . Hypothyroidism   . Kidney stones   . Liver hemangioma   . Lymphocytic colitis 02/2010  . Thyroid disease     Patient Active Problem List   Diagnosis Date Noted  . Alzheimer disease ([place]) 02/05/2018  . Meningioma ([place]) 02/05/2018    Past Surgical History:  Procedure Laterality Date  . BREAST CYST ASPIRATION  1982   Dr. Rebekah Chesterfield  . BREAST LUMPECTOMY Right   . EYE SURGERY Right 1950s  . FROZEN RIGHT SHOULDER Right 2006  . HYSTERECTOMY     complete per pt. PCP hx says for bleeding fibroid  . kidney stones    . UTEROSCOPY FOR NEPHROLITHIASIS  1981     OB History   No obstetric history on file.      Home Medications    Prior to Admission medications   Medication Sig Start Date End Date Taking? Authorizing Provider  busPIRone (BUSPAR) 5 MG tablet Take 5 mg by mouth as needed.    [provider]  donepezil (ARICEPT) 10 MG tablet Take 1 tablet (10 mg total) by mouth at bedtime. 02/05/18   Melvenia Beam, MD  hydrochlorothiazide (HYDRODIURIL) 25 MG tablet Take 25 mg by mouth daily.    [provider]  levothyroxine (SYNTHROID, LEVOTHROID) 100 MCG  tablet Take 100 mcg by mouth daily before breakfast.    [provider]  Omega-3 Fatty Acids (OMEGA 3 PO) Take 1 capsule by mouth 2 (two) times daily.    [provider]  Probiotic Product (PROBIOTIC PO) Take by mouth daily.    [provider]    Family History Family History  Problem Relation Age of Onset  . Heart attack Mother   . Heart disease Mother   . Arthritis Mother   . Other Father        killed on the job  . Throat cancer Brother   . Other Brother        ?heart problems-couldn't pass a physical to do any high school sports  . Emphysema Brother   . Alzheimer's disease Other        runs in the family     Social History Social History   Tobacco Use  . Smoking status: Never Smoker  . Smokeless tobacco: Never Used  Substance Use Topics  . Alcohol use: Never    Frequency: Never  . Drug use: Never     Allergies   Cephalexin, Entocort ec [budesonide], Lipitor [atorvastatin], Bee venom, and Escitalopram   Review of Systems Review of Systems  Constitutional: Positive for chills and  fever.  HENT: Negative for ear pain and sore throat.   Eyes: Negative for pain and visual disturbance.  Respiratory: Negative for cough and shortness of breath.   Cardiovascular: Negative for chest pain and palpitations.  Gastrointestinal: Positive for diarrhea. Negative for abdominal pain and vomiting.  Genitourinary: Negative for dysuria and hematuria.  Musculoskeletal: Negative for arthralgias and back pain.  Skin: Negative for color change and rash.  Neurological: Negative for seizures and syncope.  All other systems reviewed and are negative.    Physical Exam Updated Vital Signs BP (!) 147/102 (BP Location: Right Arm)   Pulse 80   Temp (!) 102.2 F (39 C) (Oral)   Resp 19   Ht 5' (1.524 m)   Wt 41.7 kg   SpO2 98%   BMI 17.97 kg/m   Physical Exam Vitals signs and nursing note reviewed.  Constitutional:      General: She is not in acute  distress.    Appearance: She is well-developed.  HENT:     Head: Normocephalic and atraumatic.  Eyes:     Conjunctiva/sclera: Conjunctivae normal.  Neck:     Musculoskeletal: Neck supple.  Cardiovascular:     Rate and Rhythm: Normal rate and regular rhythm.     Heart sounds: No murmur.  Pulmonary:     Effort: Pulmonary effort is normal. No respiratory distress.     Breath sounds: Normal breath sounds.  Abdominal:     Palpations: Abdomen is soft.     Tenderness: There is no abdominal tenderness.  Musculoskeletal:        General: No swelling or tenderness.  Skin:    General: Skin is warm and dry.     Capillary Refill: Capillary refill takes less than 2 seconds.  Neurological:     General: No focal deficit present.     Mental Status: She is alert and oriented to person, place, and time.  Psychiatric:        Mood and Affect: Mood normal.        Behavior: Behavior normal.      ED Treatments / Results  Labs (all labs ordered are listed, but only abnormal results are displayed) Labs Reviewed  SARS CORONAVIRUS 2 AG (30 MIN TAT)  CULTURE, BLOOD (ROUTINE X 2)  CULTURE, BLOOD (ROUTINE X 2)  CBC WITH DIFFERENTIAL/PLATELET  COMPREHENSIVE METABOLIC PANEL  LIPASE, BLOOD  URINALYSIS, ROUTINE W REFLEX MICROSCOPIC    EKG None  Radiology No results found.  Procedures Procedures (including critical care time)  Medications Ordered in ED Medications  acetaminophen (TYLENOL) tablet 650 mg (has no administration in time range)     Initial Impression / Assessment and Plan / ED Course  I have reviewed the triage vital signs and the nursing notes.  Pertinent labs & imaging results that were available during my care of the patient were reviewed by me and considered in my medical decision making (see chart for details).  Clinical Course as of Jul 22 2317  Thu Jul 23, 2019  2311 Signed out to Dr. Nicholes Stairs   [RD]    Clinical Course User Index [RD] Lucrezia Starch, MD        85 year old lady presents to ER with fever, diarrhea.  On exam she is well-appearing, initial vitals concerning for fever but otherwise stable.  Labs mild leukocytosis, chest x-ray negative for pneumonia, no definite UTI.  Sent for Covid swab.  CT abdomen pelvis ordered to evaluate for acute abdominal process to explain fever.  While  awaiting CT scan, patient signed out to Dr. Nicholes Stairs.  Please refer to her note for final plan disposition.  Final Clinical Impressions(s) / ED Diagnoses   Final diagnoses:  Fever, unspecified fever cause  Diarrhea, unspecified type    ED Discharge Orders    None       Lucrezia Starch, MD 07/23/19 2321

## 2019-07-23 NOTE — ED Triage Notes (Signed)
Pt presents with fever, several episodes of uncontrollable diarrhea and dizziness. Pt has hx of colitis. Pt also had dental work done on Monday. No meds PTA.

## 2019-07-24 DIAGNOSIS — R197 Diarrhea, unspecified: Secondary | ICD-10-CM | POA: Diagnosis not present

## 2019-07-24 LAB — SARS CORONAVIRUS 2 (TAT 6-24 HRS): SARS Coronavirus 2: NEGATIVE

## 2019-07-24 MED ORDER — FOSFOMYCIN TROMETHAMINE 3 G PO PACK
3.0000 g | PACK | Freq: Once | ORAL | Status: AC
  Administered 2019-07-24: 3 g via ORAL
  Filled 2019-07-24: qty 3

## 2019-07-24 MED ORDER — IOHEXOL 300 MG/ML  SOLN
100.0000 mL | Freq: Once | INTRAMUSCULAR | Status: AC | PRN
  Administered 2019-07-24: 100 mL via INTRAVENOUS

## 2019-07-24 NOTE — Discharge Instructions (Addendum)
Person Under Monitoring Name: Alexandria Murphy  Location: [place] [address]   Infection Prevention Recommendations for Individuals Confirmed to have, or Being Evaluated for, 2019 Novel Coronavirus (COVID-19) Infection Who Receive Care at Home  Individuals who are confirmed to have, or are being evaluated for, COVID-19 should follow the prevention steps below until a healthcare provider or local or state health department says they can return to normal activities.  Stay home except to get medical care You should restrict activities outside your home, except for getting medical care. Do not go to work, school, or public areas, and do not use public transportation or taxis.  Call ahead before visiting your doctor Before your medical appointment, call the healthcare provider and tell them that you have, or are being evaluated for, COVID-19 infection. This will help the healthcare providers office take steps to keep other people from getting infected. Ask your healthcare provider to call the local or state health department.  Monitor your symptoms Seek prompt medical attention if your illness is worsening (e.g., difficulty breathing). Before going to your medical appointment, call the healthcare provider and tell them that you have, or are being evaluated for, COVID-19 infection. Ask your healthcare provider to call the local or state health department.  Wear a facemask You should wear a facemask that covers your nose and mouth when you are in the same room with other people and when you visit a healthcare provider. People who live with or visit you should also wear a facemask while they are in the same room with you.  Separate yourself from other people in your home As much as possible, you should stay in a different room from other people in your home. Also, you should use a separate bathroom, if available.  Avoid sharing household items You should not share  dishes, drinking glasses, cups, eating utensils, towels, bedding, or other items with other people in your home. After using these items, you should wash them thoroughly with soap and water.  Cover your coughs and sneezes Cover your mouth and nose with a tissue when you cough or sneeze, or you can cough or sneeze into your sleeve. Throw used tissues in a lined trash can, and immediately wash your hands with soap and water for at least 20 seconds or use an alcohol-based hand rub.  Wash your [facility] your hands often and thoroughly with soap and water for at least 20 seconds. You can use an alcohol-based hand sanitizer if soap and water are not available and if your hands are not visibly dirty. Avoid touching your eyes, nose, and mouth with unwashed hands.   Prevention Steps for Caregivers and Household Members of Individuals Confirmed to have, or Being Evaluated for, COVID-19 Infection Being Cared for in the Home  If you live with, or provide care at home for, a person confirmed to have, or being evaluated for, COVID-19 infection please follow these guidelines to prevent infection:  Follow healthcare providers instructions Make sure that you understand and can help the patient follow any healthcare provider instructions for all care.  Provide for the patients basic needs You should help the patient with basic needs in the home and provide support for getting groceries, prescriptions, and other personal needs.  Monitor the patients symptoms If they are getting sicker, call his or her medical provider and tell them that the patient has, or is being evaluated for, COVID-19 infection. This will help the healthcare providers office  take steps to keep other people from getting infected. °Ask the healthcare provider to call the local or state health department. ° °Limit the number of people who have contact with the patient °If possible, have only one caregiver for the patient. °Other  household members should stay in another home or place of residence. If this is not possible, they should stay °in another room, or be separated from the patient as much as possible. Use a separate bathroom, if available. °Restrict visitors who do not have an essential need to be in the home. ° °Keep older adults, very young children, and other sick people away from the patient °Keep older adults, very young children, and those who have compromised immune systems or chronic health conditions away from the patient. This includes people with chronic heart, lung, or kidney conditions, diabetes, and cancer. ° °Ensure good ventilation °Make sure that shared spaces in the home have good air flow, such as from an air conditioner or an opened window, °weather permitting. ° °Wash your hands often °Wash your hands often and thoroughly with soap and water for at least 20 seconds. You can use an alcohol based hand sanitizer if soap and water are not available and if your hands are not visibly dirty. °Avoid touching your eyes, nose, and mouth with unwashed hands. °Use disposable paper towels to dry your hands. If not available, use dedicated cloth towels and replace them when they become wet. ° °Wear a facemask and gloves °Wear a disposable facemask at all times in the room and gloves when you touch or have contact with the patient’s blood, body fluids, and/or secretions or excretions, such as sweat, saliva, sputum, nasal mucus, vomit, urine, or feces.  Ensure the mask fits over your nose and mouth tightly, and do not touch it during use. °Throw out disposable facemasks and gloves after using them. Do not reuse. °Wash your hands immediately after removing your facemask and gloves. °If your personal clothing becomes contaminated, carefully remove clothing and launder. Wash your hands after handling contaminated clothing. °Place all used disposable facemasks, gloves, and other waste in a lined container before disposing them with  other household waste. °Remove gloves and wash your hands immediately after handling these items. ° °Do not share dishes, glasses, or other household items with the patient °Avoid sharing household items. You should not share dishes, drinking glasses, cups, eating utensils, towels, bedding, or other items with a patient who is confirmed to have, or being evaluated for, COVID-19 infection. °After the person uses these items, you should wash them thoroughly with soap and water. ° °Wash laundry thoroughly °Immediately remove and wash clothes or bedding that have blood, body fluids, and/or secretions or excretions, such as sweat, saliva, sputum, nasal mucus, vomit, urine, or feces, on them. °Wear gloves when handling laundry from the patient. °Read and follow directions on labels of laundry or clothing items and detergent. In general, wash and dry with the warmest temperatures recommended on the label. ° °Clean all areas the individual has used often °Clean all touchable surfaces, such as counters, tabletops, doorknobs, bathroom fixtures, toilets, phones, keyboards, tablets, and bedside tables, every day. Also, clean any surfaces that may have blood, body fluids, and/or secretions or excretions on them. °Wear gloves when cleaning surfaces the patient has come in contact with. °Use a diluted bleach solution (e.g., dilute bleach with 1 part bleach and 10 parts water) or a household disinfectant with a label that says EPA-registered for coronaviruses. To make a bleach   solution at home, add 1 tablespoon of bleach to 1 quart (4 cups) of water. For a larger supply, add  cup of bleach to 1 gallon (16 cups) of water. Read labels of cleaning products and follow recommendations provided on product labels. Labels contain instructions for safe and effective use of the cleaning product including precautions you should take when applying the product, such as wearing gloves or eye protection and making sure you have good ventilation  during use of the product. Remove gloves and wash hands immediately after cleaning.  Monitor yourself for signs and symptoms of illness Caregivers and household members are considered close contacts, should monitor their health, and will be asked to limit movement outside of the home to the extent possible. Follow the monitoring steps for close contacts listed on the symptom monitoring form.   ? If you have additional questions, contact your local health department or call the epidemiologist on call at [phone] (available 24/7). ? This guidance is subject to change. For the most up-to-date guidance from [facility], please refer to their website: YouBlogs.pl

## 2019-07-25 LAB — URINE CULTURE: Culture: <10000 — AB

## 2019-07-26 ENCOUNTER — Telehealth (HOSPITAL_BASED_OUTPATIENT_CLINIC_OR_DEPARTMENT_OTHER): Payer: Self-pay | Admitting: Emergency Medicine

## 2019-07-27 ENCOUNTER — Telehealth: Payer: Self-pay | Admitting: *Deleted

## 2019-07-27 NOTE — Telephone Encounter (Signed)
Patient's daughter called for results ,unable to give dues to [employer] ,advised to have patient call back for results .

## 2019-07-28 DIAGNOSIS — N390 Urinary tract infection, site not specified: Secondary | ICD-10-CM | POA: Diagnosis not present

## 2019-07-28 DIAGNOSIS — E039 Hypothyroidism, unspecified: Secondary | ICD-10-CM | POA: Diagnosis not present

## 2019-07-28 DIAGNOSIS — R2681 Unsteadiness on feet: Secondary | ICD-10-CM | POA: Diagnosis not present

## 2019-07-29 LAB — CULTURE, BLOOD (ROUTINE X 2)
Culture: NO GROWTH
Culture: NO GROWTH
Special Requests: ADEQUATE
Special Requests: ADEQUATE

## 2019-08-07 DIAGNOSIS — R197 Diarrhea, unspecified: Secondary | ICD-10-CM | POA: Diagnosis not present

## 2019-08-13 ENCOUNTER — Other Ambulatory Visit: Payer: Self-pay

## 2019-08-13 ENCOUNTER — Ambulatory Visit (INDEPENDENT_AMBULATORY_CARE_PROVIDER_SITE_OTHER): Payer: Medicare HMO | Admitting: Family Medicine

## 2019-08-13 ENCOUNTER — Encounter: Payer: Self-pay | Admitting: Family Medicine

## 2019-08-13 ENCOUNTER — Ambulatory Visit: Payer: Medicare HMO | Admitting: Family Medicine

## 2019-08-13 VITALS — BP 124/70 | HR 71 | Temp 96.9°F | Ht 60.0 in | Wt 108.6 lb

## 2019-08-13 DIAGNOSIS — G301 Alzheimer's disease with late onset: Secondary | ICD-10-CM

## 2019-08-13 DIAGNOSIS — F0280 Dementia in other diseases classified elsewhere without behavioral disturbance: Secondary | ICD-10-CM

## 2019-08-13 MED ORDER — MEMANTINE HCL 5 MG PO TABS: 5.0000 mg | ORAL_TABLET | Freq: Every day | ORAL | 2 refills | Status: DC

## 2019-08-13 NOTE — Patient Instructions (Signed)
Continue Aricept 10mg  daily  We will start Namenda 5mg  daily  Be as physically active as possible.   Caution with driving, do not drive alone  Follow up in 6 weeks, we will increase Namenda if well tolerated.    Dementia Caregiver Guide Dementia is a term used to describe a number of symptoms that affect memory and thinking. The most common symptoms include:  Memory loss.  Trouble with language and communication.  Trouble concentrating.  Poor judgment.  Problems with reasoning.  Child-like behavior and language.  Extreme anxiety.  Angry outbursts.  Wandering from home or public places. Dementia usually gets worse slowly over time. In the early stages, people with dementia can stay independent and safe with some help. In later stages, they need help with daily tasks such as dressing, grooming, and using the bathroom. How to help the person with dementia cope Dementia can be frightening and confusing. Here are some tips to help the person with dementia cope with changes caused by the disease. General tips  Keep the person on track with his or her routine.  Try to identify areas where the person may need help.  Be supportive, patient, calm, and encouraging.  Gently remind the person that adjusting to changes takes time.  Help with the tasks that the person has asked for help with.  Keep the person involved in daily tasks and decisions as much as possible.  Encourage conversation, but try not to get frustrated or harried if the person struggles to find words or does not seem to appreciate your help. Communication tips  When the person is talking or seems frustrated, make eye contact and hold the person's hand.  Ask specific questions that need yes or no answers.  Use simple words, short sentences, and a calm voice. Only give one direction at a time.  When offering choices, limit them to just 1 or 2.  Avoid correcting the person in a negative way.  If the  person is struggling to find the right words, gently try to help him or her. How to recognize symptoms of stress Symptoms of stress in caregivers include:  Feeling frustrated or angry with the person with dementia.  Denying that the person has dementia or that his or her symptoms will not improve.  Feeling hopeless and unappreciated.  Difficulty sleeping.  Difficulty concentrating.  Feeling anxious, irritable, or depressed.  Developing stress-related health problems.  Feeling like you have too little time for your own life. Follow these instructions at home:   Make sure that you and the person you are caring for: ? Get regular sleep. ? Exercise regularly. ? Eat regular, nutritious meals. ? Drink enough fluid to keep your urine clear or pale yellow. ? Take over-the-counter and prescription medicines only as told by your health care providers. ? Attend all scheduled health care appointments.  Join a support group with others who are caregivers.  Ask about respite care resources so that you can have a regular break from the stress of caregiving.  Look for signs of stress in yourself and in the person you are caring for. If you notice signs of stress, take steps to manage it.  Consider any safety risks and take steps to avoid them.  Organize medications in a pill box for each day of the week.  Create a plan to handle any legal or financial matters. Get legal or financial advice if needed.  Keep a calendar in a central location to remind the person of  appointments or other activities. Tips for reducing the risk of injury  Keep floors clear of clutter. Remove rugs, magazine racks, and floor lamps.  Keep hallways well lit, especially at night.  Put a handrail and nonslip mat in the bathtub or shower.  Put childproof locks on cabinets that contain dangerous items, such as medicines, alcohol, guns, toxic cleaning items, sharp tools or utensils, matches, and lighters.  Put  the locks in places where the person cannot see or reach them easily. This will help ensure that the person does not wander out of the house and get lost.  Be prepared for emergencies. Keep a list of emergency phone numbers and addresses in a convenient area.  Remove car keys and lock garage doors so that the person does not try to get in the car and drive.  Have the person wear a bracelet that tracks locations and identifies the person as having memory problems. This should be worn at all times for safety. Where to find support: Many individuals and organizations offer support. These include:  Support groups for people with dementia and for caregivers.  Counselors or therapists.  Home health care services.  Adult day care centers. Where to find more information Alzheimer's Association: [employer].nz Contact a health care provider if:  The person's health is rapidly getting worse.  You are no longer able to care for the person.  Caring for the person is affecting your physical and emotional health.  The person threatens himself or herself, you, or anyone else. Summary  Dementia is a term used to describe a number of symptoms that affect memory and thinking.  Dementia usually gets worse slowly over time.  Take steps to reduce the person's risk of injury, and to plan for future care.  Caregivers need support, relief from caregiving, and time for their own lives. This information is not intended to replace advice given to you by your health care provider. Make sure you discuss any questions you have with your health care provider. Document Released: 07/17/2016 Document Revised: 07/26/2017 Document Reviewed: 07/17/2016 Elsevier Patient Education  2020 [employer].

## 2019-08-13 NOTE — Progress Notes (Addendum)
PATIENT: Alexandria Murphy DOB: [birth year]  REASON FOR VISIT: follow up HISTORY FROM: patient  Chief Complaint  Patient presents with  . Follow-up    RM1, with husband. Memory worsening a little. Gets sick easily.     HISTORY OF PRESENT ILLNESS: Today 08/16/19 Alexandria Murphy is a 85 y.o. female here today for follow up for dementia. She continues Aricept and is tolerating without adverse effects. She feels that memory is fairly stable. Her husband presents with her today and states that she is a little more forgetful. She remains active. No falls. No assistive devices. She only drive to familiar, local places with her husband in the car. She does not drive alone. Her husband can not see well. She is able to perform all ADL's. She does have a history of colitis.   HISTORY: (copied from my note on 02/11/2019)  [name] is a 85 y.o. female here today for follow up for memory loss.  She is present today with her husband,, who aids in history.  He reports that she is well.  She states since her last.  She is continuing Aricept 10 mg at bedtime and she states that her only concern is intermittent diarrhea.  She does not feel that this is severe enough to stop the change but has noticed but it has continued she denies any concerns hydration.  Is tolerating Aricept otherwise.  She does continue to drive but is always accompanied by her husband.  She denies any accidents or getting lost.  She is able to dress and bathe herself she is feeling well today.   REVIEW OF SYSTEMS: Out of a complete 14 system review of symptoms, the patient complains only of the following symptoms, memory loss and all other reviewed systems are negative.  ALLERGIES: Allergies  Allergen Reactions  . Cephalexin Hives  . Entocort Ec [Budesonide] Rash and Other (See Comments)    Elevated liver enzymes  . Lipitor [Atorvastatin] Rash and Other (See Comments)    Elevated liver enzymes  . Bee Venom   . Escitalopram      HOME MEDICATIONS: Outpatient Medications Prior to Visit  Medication Sig Dispense Refill  . busPIRone (BUSPAR) 5 MG tablet Take 5 mg by mouth as needed.    . donepezil (ARICEPT) 10 MG tablet Take 1 tablet (10 mg total) by mouth at bedtime. 30 tablet 11  . hydrochlorothiazide (HYDRODIURIL) 25 MG tablet Take 25 mg by mouth daily.    Marland Kitchen levothyroxine (SYNTHROID, LEVOTHROID) 100 MCG tablet Take 100 mcg by mouth daily before breakfast.    . Omega-3 Fatty Acids (OMEGA 3 PO) Take 1 capsule by mouth 2 (two) times daily.    . Potassium Chloride ER 20 MEQ TBCR     . Probiotic Product (PROBIOTIC PO) Take by mouth daily.     No facility-administered medications prior to visit.    PAST MEDICAL HISTORY: Past Medical History:  Diagnosis Date  . Alzheimer disease ([place])   . Colitis    microscopic  . Diverticulosis   . Hypertension   . Hypothyroidism   . Kidney stones   . Liver hemangioma   . Lymphocytic colitis 02/2010  . Thyroid disease     PAST SURGICAL HISTORY: Past Surgical History:  Procedure Laterality Date  . BREAST CYST ASPIRATION  1982   Dr. Rebekah Chesterfield  . BREAST LUMPECTOMY Right   . EYE SURGERY Right 1950s  . FROZEN RIGHT SHOULDER Right 2006  . HYSTERECTOMY  complete per pt. PCP hx says for bleeding fibroid  . kidney stones    . UTEROSCOPY FOR NEPHROLITHIASIS  1981    FAMILY HISTORY: Family History  Problem Relation Age of Onset  . Heart attack Mother   . Heart disease Mother   . Arthritis Mother   . Other Father        killed on the job  . Throat cancer Brother   . Other Brother        ?heart problems-couldn't pass a physical to do any high school sports  . Emphysema Brother   . Alzheimer's disease Other        runs in the family     SOCIAL HISTORY: Social History   Socioeconomic History  . Marital status: Married    Spouse name: Not on file  . Number of children: 3  . Years of education: Not on file  . Highest education level: High school graduate    Occupational History  . Not on file  Tobacco Use  . Smoking status: Never Smoker  . Smokeless tobacco: Never Used  Substance and Sexual Activity  . Alcohol use: Never  . Drug use: Never  . Sexual activity: Not on file  Other Topics Concern  . Not on file  Social History Narrative   Lives at home with her husband Homer   Right handed   Caffeine: 1 can of mtn dew daily   Social Determinants of Health   Financial Resource Strain:   . Difficulty of Paying Living Expenses: Not on file  Food Insecurity:   . Worried About Charity fundraiser in the Last Year: Not on file  . Ran Out of Food in the Last Year: Not on file  Transportation Needs:   . Lack of Transportation (Medical): Not on file  . Lack of Transportation (Non-Medical): Not on file  Physical Activity:   . Days of Exercise per Week: Not on file  . Minutes of Exercise per Session: Not on file  Stress:   . Feeling of Stress : Not on file  Social Connections:   . Frequency of Communication with Friends and Family: Not on file  . Frequency of Social Gatherings with Friends and Family: Not on file  . Attends Religious Services: Not on file  . Active Member of Clubs or Organizations: Not on file  . Attends Archivist Meetings: Not on file  . Marital Status: Not on file  Intimate Partner Violence:   . Fear of Current or Ex-Partner: Not on file  . Emotionally Abused: Not on file  . Physically Abused: Not on file  . Sexually Abused: Not on file      PHYSICAL EXAM  Vitals:   08/13/19 1046  BP: 124/70  Pulse: 71  Temp: (!) 96.9 F (36.1 C)  Weight: 108 lb 9.6 oz (49.3 kg)  Height: 5' (1.524 m)   Body mass index is 21.21 kg/m.  Generalized: Well developed, in no acute distress  Cardiology: normal rate and rhythm, no murmur noted Neurological examination  Mentation: Alert, mostly oriented to time, place, history taking (some trouble with recall and needs prompting at times) . Follows all commands  speech and language fluent Cranial nerve II-XII: Pupils were equal round reactive to light. Extraocular movements were full, visual field were full on confrontational test. Facial sensation and strength were normal. Uvula tongue midline. Head turning and shoulder shrug  were normal and symmetric. Motor: The motor testing reveals 5 over 5  strength of all 4 extremities. Good symmetric motor tone is noted throughout.  Sensory: Sensory testing is intact to soft touch on all 4 extremities. No evidence of extinction is noted.  Coordination: Cerebellar testing reveals good finger-nose-finger and heel-to-shin bilaterally.  Gait and station: Gait is mildly unsteady but stable   DIAGNOSTIC DATA (LABS, IMAGING, TESTING) - I reviewed patient records, labs, notes, testing and imaging myself where available.  MMSE - Mini Mental State Exam 08/13/2019 08/12/2018 02/05/2018  Orientation to time 3 3 2   Orientation to Place 4 5 5   Registration 3 3 3   Attention/ Calculation 1 3 2   Recall 1 1 2   Language- name 2 objects 2 2 2   Language- repeat 1 0 1  Language- follow 3 step command 3 3 3   Language- read & follow direction 1 1 1   Write a sentence 1 1 1   Copy design 1 1 1   Copy design-comments named 5 animals - -  Total score 21 23 23      Lab Results  Component Value Date   WBC 10.6 (H) 07/23/2019   HGB 12.8 07/23/2019   HCT 37.8 07/23/2019   MCV 88.1 07/23/2019   PLT 215 07/23/2019      Component Value Date/Time   NA 134 (L) 07/23/2019 2108   K 2.8 (L) 07/23/2019 2108   CL 95 (L) 07/23/2019 2108   CO2 29 07/23/2019 2108   GLUCOSE 115 (H) 07/23/2019 2108   BUN 22 07/23/2019 2108   CREATININE 1.10 (H) 07/23/2019 2108   CALCIUM 9.2 07/23/2019 2108   PROT 6.9 07/23/2019 2108   ALBUMIN 4.0 07/23/2019 2108   AST 38 07/23/2019 2108   ALT 13 07/23/2019 2108   ALKPHOS 62 07/23/2019 2108   BILITOT 0.7 07/23/2019 2108   GFRNONAA 46 (L) 07/23/2019 2108   GFRAA 53 (L) 07/23/2019 2108   No results  found for: CHOL, HDL, LDLCALC, LDLDIRECT, TRIG, CHOLHDL No results found for: HGBA1C Lab Results  Component Value Date   VITAMINB12 1,176 02/05/2018   No results found for: TSH   ASSESSMENT AND PLAN 85 y.o. year old female  has a past medical history of Alzheimer disease ([place]), Colitis, Diverticulosis, Hypertension, Hypothyroidism, Kidney stones, Liver hemangioma, Lymphocytic colitis (02/2010), and Thyroid disease. here with     ICD-10-CM   1. Late onset Alzheimer's disease without behavioral disturbance ([place])  G30.1    F02.80     Overall Pfeiffer is doing fairly well.  She is tolerating Aricept 10 mg daily.  She does have a history of colitis with intermittent diarrhea.  We have discussed adding Namenda.  She is hesitant but does wish to try this medication.  We will start 5 mg daily.  We will titrate up very slowly and assess response closely.  She will call with any worsening of GI symptoms.  She was advised caution with driving.  I do not feel that it is safe for her to travel home.  She will drive with her husband only.  She was encouraged to continue physical and mental activity.  She will follow-up with me in 6 weeks, sooner if needed.  We will determine need to increase dose pending her response.  She and her husband was understanding of the plan.   No orders of the defined types were placed in this encounter.    Meds ordered this encounter  Medications  . memantine (NAMENDA) 5 MG tablet    Sig: Take 1 tablet (5 mg total) by mouth daily.  Dispense:  30 tablet    Refill:  2    Order Specific Question:   Supervising Provider    Answer:   Melvenia Beam [id number]      I spent 15 minutes with the patient. 50% of this time was spent counseling and educating patient on plan of care and medications.    Debbora Presto, FNP-C 08/16/2019, 6:50 PM [facility] [address] [place], [place] [address] [phone]  Made any corrections needed, and agree  with history, physical, neuro exam,assessment and plan as stated.     Sarina Ill, MD [facility]

## 2019-08-16 ENCOUNTER — Encounter: Payer: Self-pay | Admitting: Family Medicine

## 2019-09-28 ENCOUNTER — Ambulatory Visit: Payer: Medicare HMO

## 2019-10-01 ENCOUNTER — Ambulatory Visit: Payer: Medicare HMO | Attending: Internal Medicine

## 2019-10-01 DIAGNOSIS — Z23 Encounter for immunization: Secondary | ICD-10-CM | POA: Insufficient documentation

## 2019-10-01 DIAGNOSIS — E039 Hypothyroidism, unspecified: Secondary | ICD-10-CM | POA: Diagnosis not present

## 2019-10-01 DIAGNOSIS — I10 Essential (primary) hypertension: Secondary | ICD-10-CM | POA: Diagnosis not present

## 2019-10-01 DIAGNOSIS — R413 Other amnesia: Secondary | ICD-10-CM | POA: Diagnosis not present

## 2019-10-01 NOTE — Progress Notes (Signed)
   Covid-19 Vaccination Clinic  Name:  Alexandria Murphy    MRN: [id number] DOB: 1932-11-22  10/01/2019  Alexandria Murphy was observed post Covid-19 immunization for 15 minutes without incidence. She was provided with Vaccine Information Sheet and instruction to access the V-Safe system.   Alexandria Murphy was instructed to call 911 with any severe reactions post vaccine: Marland Kitchen Difficulty breathing  . Swelling of your face and throat  . A fast heartbeat  . A bad rash all over your body  . Dizziness and weakness    Immunizations Administered    Name Date Dose VIS Date Route   Pfizer COVID-19 Vaccine 10/01/2019  9:37 AM 0.3 mL 08/07/2019 Intramuscular   Manufacturer: [place]   Lot: [id number]   [place]: [id number]

## 2019-10-03 ENCOUNTER — Ambulatory Visit: Payer: Medicare HMO

## 2019-10-07 ENCOUNTER — Ambulatory Visit: Payer: Medicare HMO | Admitting: Family Medicine

## 2019-10-07 ENCOUNTER — Encounter: Payer: Self-pay | Admitting: Family Medicine

## 2019-10-07 ENCOUNTER — Other Ambulatory Visit: Payer: Self-pay

## 2019-10-07 VITALS — BP 118/58 | HR 64 | Temp 97.0°F | Ht 60.0 in | Wt 114.2 lb

## 2019-10-07 DIAGNOSIS — F0280 Dementia in other diseases classified elsewhere without behavioral disturbance: Secondary | ICD-10-CM

## 2019-10-07 DIAGNOSIS — G301 Alzheimer's disease with late onset: Secondary | ICD-10-CM | POA: Diagnosis not present

## 2019-10-07 MED ORDER — MEMANTINE HCL 5 MG PO TABS: 5.0000 mg | ORAL_TABLET | Freq: Two times a day (BID) | ORAL | 3 refills | Status: DC

## 2019-10-07 NOTE — Progress Notes (Addendum)
PATIENT: Alexandria Murphy DOB: Dec 26, 1932  REASON FOR VISIT: follow up HISTORY FROM: patient  Chief Complaint  Patient presents with  . Follow-up    6 week f/u. Daughter present. New room. No new concerns at this time.   . Medication Refill    Namenda and Donepezil     HISTORY OF PRESENT ILLNESS: Today 10/08/19 Alexandria Murphy is a 86 y.o. female here today for follow up for dementia. We started Namenda 5mg  daily at last visit in 07/2019. She reports tolerating medication well. Unfortunately, her husband was admitted to the hospital for a fall and has not been home to refill her medication. She has been out of Namenda for a couple of weeks. She tolerated it well and wishes to restart. Otherwise, she is doing well.   HISTORY: (copied from my note on 08/16/2019)  Alexandria Murphy is a 86 y.o. female here today for follow up for dementia. She continues Aricept and is tolerating without adverse effects. She feels that memory is fairly stable. Her husband presents with her today and states that she is a little more forgetful. She remains active. No falls. No assistive devices. She only drive to familiar, local places with her husband in the car. She does not drive alone. Her husband can not see well. She is able to perform all ADL's. She does have a history of colitis.   HISTORY: (copied from my note on 02/11/2019)  Alexandria Half A Hardenis a 86 y.o.femalehere today for follow up for memory loss.She is present today with her husband,, who aids in history. He reports that she is well. She states since her last. She is continuing Aricept 10 mg at bedtime and she states that her only concern is intermittent diarrhea. She does not feel that this is severe enough to stop the change but has noticed but it has continued she denies any concerns hydration. Is tolerating Aricept otherwise. She does continue to drive but is always accompanied by her husband. She denies any accidents or getting lost.  She is able to dress and bathe herself she is feeling well today.   REVIEW OF SYSTEMS: Out of a complete 14 system review of symptoms, the patient complains only of the following symptoms, none and all other reviewed systems are negative.  ALLERGIES: Allergies  Allergen Reactions  . Cephalexin Hives  . Entocort Ec [Budesonide] Rash and Other (See Comments)    Elevated liver enzymes  . Lipitor [Atorvastatin] Rash and Other (See Comments)    Elevated liver enzymes  . Bee Venom   . Escitalopram     HOME MEDICATIONS: Outpatient Medications Prior to Visit  Medication Sig Dispense Refill  . busPIRone (BUSPAR) 5 MG tablet Take 5 mg by mouth as needed.    . donepezil (ARICEPT) 10 MG tablet Take 1 tablet (10 mg total) by mouth at bedtime. 30 tablet 11  . hydrochlorothiazide (HYDRODIURIL) 25 MG tablet Take 25 mg by mouth daily.    Marland Kitchen levothyroxine (SYNTHROID, LEVOTHROID) 100 MCG tablet Take 100 mcg by mouth daily before breakfast.    . Potassium Chloride ER 20 MEQ TBCR     . Probiotic Product (PROBIOTIC PO) Take by mouth daily.    . memantine (NAMENDA) 5 MG tablet Take 1 tablet (5 mg total) by mouth daily. 30 tablet 2  . Omega-3 Fatty Acids (OMEGA 3 PO) Take 1 capsule by mouth 2 (two) times daily.     No facility-administered medications prior to visit.  PAST MEDICAL HISTORY: Past Medical History:  Diagnosis Date  . Alzheimer disease ([place])   . Colitis    microscopic  . Diverticulosis   . Hypertension   . Hypothyroidism   . Kidney stones   . Liver hemangioma   . Lymphocytic colitis 02/2010  . Thyroid disease     PAST SURGICAL HISTORY: Past Surgical History:  Procedure Laterality Date  . BREAST CYST ASPIRATION  1982   Dr. Rebekah Chesterfield  . BREAST LUMPECTOMY Right   . EYE SURGERY Right 1950s  . FROZEN RIGHT SHOULDER Right 2006  . HYSTERECTOMY     complete per pt. PCP hx says for bleeding fibroid  . kidney stones    . UTEROSCOPY FOR NEPHROLITHIASIS  1981    FAMILY  HISTORY: Family History  Problem Relation Age of Onset  . Heart attack Mother   . Heart disease Mother   . Arthritis Mother   . Other Father        killed on the job  . Throat cancer Brother   . Other Brother        ?heart problems-couldn't pass a physical to do any high school sports  . Emphysema Brother   . Alzheimer's disease Other        runs in the family     SOCIAL HISTORY: Social History   Socioeconomic History  . Marital status: Married    Spouse name: Not on file  . Number of children: 3  . Years of education: Not on file  . Highest education level: High school graduate  Occupational History  . Not on file  Tobacco Use  . Smoking status: Never Smoker  . Smokeless tobacco: Never Used  Substance and Sexual Activity  . Alcohol use: Never  . Drug use: Never  . Sexual activity: Not on file  Other Topics Concern  . Not on file  Social History Narrative   Lives at home with her husband Alexandria Murphy   Right handed   Caffeine: 1 can of mtn dew daily   Social Determinants of Health   Financial Resource Strain:   . Difficulty of Paying Living Expenses: Not on file  Food Insecurity:   . Worried About Charity fundraiser in the Last Year: Not on file  . Ran Out of Food in the Last Year: Not on file  Transportation Needs:   . Lack of Transportation (Medical): Not on file  . Lack of Transportation (Non-Medical): Not on file  Physical Activity:   . Days of Exercise per Week: Not on file  . Minutes of Exercise per Session: Not on file  Stress:   . Feeling of Stress : Not on file  Social Connections:   . Frequency of Communication with Friends and Family: Not on file  . Frequency of Social Gatherings with Friends and Family: Not on file  . Attends Religious Services: Not on file  . Active Member of Clubs or Organizations: Not on file  . Attends Archivist Meetings: Not on file  . Marital Status: Not on file  Intimate Partner Violence:   . Fear of Current or  Ex-Partner: Not on file  . Emotionally Abused: Not on file  . Physically Abused: Not on file  . Sexually Abused: Not on file      PHYSICAL EXAM  Vitals:   10/07/19 1339  BP: (!) 118/58  Pulse: 64  Temp: (!) 97 F (36.1 C)  TempSrc: Oral  Weight: 114 lb 3.2 oz (51.8 kg)  Height: 5' (1.524 m)   Body mass index is 22.3 kg/m.  Generalized: Well developed, in no acute distress  Cardiology: normal rate and rhythm, no murmur noted Respiratory: clear to auscultation bilaterally  Neurological examination  Mentation: Alert oriented to time, place, some history taking. Daughter, Lynelle Smoke, aids in history. Follows all commands speech and language fluent Cranial nerve II-XII: Pupils were equal round reactive to light. Extraocular movements were full, visual field were full  Motor: The motor testing reveals 5 over 5 strength of all 4 extremities. Good symmetric motor tone is noted throughout.  Gait and station: Gait is normal.   DIAGNOSTIC DATA (LABS, IMAGING, TESTING) - I reviewed patient records, labs, notes, testing and imaging myself where available.  MMSE - Mini Mental State Exam 10/07/2019 08/13/2019 08/12/2018  Not completed: (No Data) - -  Orientation to time 3 3 3   Orientation to Place 3 4 5   Registration 3 3 3   Attention/ Calculation 1 1 3   Recall 3 1 1   Language- name 2 objects 2 2 2   Language- repeat 0 1 0  Language- follow 3 step command 3 3 3   Language- read & follow direction 1 1 1   Write a sentence 1 1 1   Copy design 1 1 1   Copy design-comments - named 5 animals -  Total score 21 21 23      Lab Results  Component Value Date   WBC 10.6 (H) 07/23/2019   HGB 12.8 07/23/2019   HCT 37.8 07/23/2019   MCV 88.1 07/23/2019   PLT 215 07/23/2019      Component Value Date/Time   NA 134 (L) 07/23/2019 2108   K 2.8 (L) 07/23/2019 2108   CL 95 (L) 07/23/2019 2108   CO2 29 07/23/2019 2108   GLUCOSE 115 (H) 07/23/2019 2108   BUN 22 07/23/2019 2108   CREATININE 1.10 (H)  07/23/2019 2108   CALCIUM 9.2 07/23/2019 2108   PROT 6.9 07/23/2019 2108   ALBUMIN 4.0 07/23/2019 2108   AST 38 07/23/2019 2108   ALT 13 07/23/2019 2108   ALKPHOS 62 07/23/2019 2108   BILITOT 0.7 07/23/2019 2108   GFRNONAA 46 (L) 07/23/2019 2108   GFRAA 53 (L) 07/23/2019 2108   No results found for: CHOL, HDL, LDLCALC, LDLDIRECT, TRIG, CHOLHDL No results found for: HGBA1C Lab Results  Component Value Date   VITAMINB12 1,176 02/05/2018   No results found for: TSH   ASSESSMENT AND PLAN 86 y.o. year old female  has a past medical history of Alzheimer disease ([place]), Colitis, Diverticulosis, Hypertension, Hypothyroidism, Kidney stones, Liver hemangioma, Lymphocytic colitis (02/2010), and Thyroid disease. here with     ICD-10-CM   1. Late onset Alzheimer's disease without behavioral disturbance ([place])  G30.1    F02.80     We will continue Aricept and restart Namenda. She will take Namenda 5mg  daily for 2 weeks then increase to 5mg  twice daily. We will increase if well tolerated at next follow up. She was advised not to drive alone. Memory compensation strategies reviewed. Activity as tolerated. She will follow up with me in 4 months.    No orders of the defined types were placed in this encounter.    Meds ordered this encounter  Medications  . memantine (NAMENDA) 5 MG tablet    Sig: Take 1 tablet (5 mg total) by mouth 2 (two) times daily.    Dispense:  180 tablet    Refill:  3    Order Specific Question:   Supervising Provider  AnswerMelvenia Beam [id number]      I spent 15 minutes with the patient. 50% of this time was spent counseling and educating patient on plan of care and medications.    Debbora Presto, FNP-C 10/08/2019, 3:57 PM [facility] [address], [place], [place] [address] [phone]  Made any corrections needed, and agree with history, physical, neuro exam,assessment and plan as stated.     Sarina Ill, MD [facility]

## 2019-10-07 NOTE — Patient Instructions (Signed)
We will continue Aricpet 10mg  daily  Resume Namenda 5mg  daily for two weeks. Increase Namenda to 5mg  twice daily thereafter   Caution with driving, drive with someone and only daytime/local driving   Follow up with me in 4 months    Memory Compensation Strategies  1. Use "WARM" strategy.  W= write it down  A= associate it  R= repeat it  M= make a mental note  2.   You can keep a Social worker.  Use a 3-ring notebook with sections for the following: calendar, important names and phone numbers,  medications, doctors' names/phone numbers, lists/reminders, and a section to journal what you did  each day.   3.    Use a calendar to write appointments down.  4.    Write yourself a schedule for the day.  This can be placed on the calendar or in a separate section of the Memory Notebook.  Keeping a  regular schedule can help memory.  5.    Use medication organizer with sections for each day or morning/evening pills.  You may need help loading it  6.    Keep a basket, or pegboard by the door.  Place items that you need to take out with you in the basket or on the pegboard.  You may also want to  include a message board for reminders.  7.    Use sticky notes.  Place sticky notes with reminders in a place where the task is performed.  For example: " turn off the  stove" placed by the stove, "lock the door" placed on the door at eye level, " take your medications" on  the bathroom mirror or by the place where you normally take your medications.  8.    Use alarms/timers.  Use while cooking to remind yourself to check on food or as a reminder to take your medicine, or as a  reminder to make a call, or as a reminder to perform another task, etc.   Dementia Caregiver Guide Dementia is a term used to describe a number of symptoms that affect memory and thinking. The most common symptoms include:  Memory loss.  Trouble with language and communication.  Trouble concentrating.  Poor  judgment.  Problems with reasoning.  Child-like behavior and language.  Extreme anxiety.  Angry outbursts.  Wandering from home or public places. Dementia usually gets worse slowly over time. In the early stages, people with dementia can stay independent and safe with some help. In later stages, they need help with daily tasks such as dressing, grooming, and using the bathroom. How to help the person with dementia cope Dementia can be frightening and confusing. Here are some tips to help the person with dementia cope with changes caused by the disease. General tips  Keep the person on track with his or her routine.  Try to identify areas where the person may need help.  Be supportive, patient, calm, and encouraging.  Gently remind the person that adjusting to changes takes time.  Help with the tasks that the person has asked for help with.  Keep the person involved in daily tasks and decisions as much as possible.  Encourage conversation, but try not to get frustrated or harried if the person struggles to find words or does not seem to appreciate your help. Communication tips  When the person is talking or seems frustrated, make eye contact and hold the person's hand.  Ask specific questions that need yes or no answers.  Use simple words, short sentences, and a calm voice. Only give one direction at a time.  When offering choices, limit them to just 1 or 2.  Avoid correcting the person in a negative way.  If the person is struggling to find the right words, gently try to help him or her. How to recognize symptoms of stress Symptoms of stress in caregivers include:  Feeling frustrated or angry with the person with dementia.  Denying that the person has dementia or that his or her symptoms will not improve.  Feeling hopeless and unappreciated.  Difficulty sleeping.  Difficulty concentrating.  Feeling anxious, irritable, or depressed.  Developing stress-related  health problems.  Feeling like you have too little time for your own life. Follow these instructions at home:   Make sure that you and the person you are caring for: ? Get regular sleep. ? Exercise regularly. ? Eat regular, nutritious meals. ? Drink enough fluid to keep your urine clear or pale yellow. ? Take over-the-counter and prescription medicines only as told by your health care providers. ? Attend all scheduled health care appointments.  Join a support group with others who are caregivers.  Ask about respite care resources so that you can have a regular break from the stress of caregiving.  Look for signs of stress in yourself and in the person you are caring for. If you notice signs of stress, take steps to manage it.  Consider any safety risks and take steps to avoid them.  Organize medications in a pill box for each day of the week.  Create a plan to handle any legal or financial matters. Get legal or financial advice if needed.  Keep a calendar in a central location to remind the person of appointments or other activities. Tips for reducing the risk of injury  Keep floors clear of clutter. Remove rugs, magazine racks, and floor lamps.  Keep hallways well lit, especially at night.  Put a handrail and nonslip mat in the bathtub or shower.  Put childproof locks on cabinets that contain dangerous items, such as medicines, alcohol, guns, toxic cleaning items, sharp tools or utensils, matches, and lighters.  Put the locks in places where the person cannot see or reach them easily. This will help ensure that the person does not wander out of the house and get lost.  Be prepared for emergencies. Keep a list of emergency phone numbers and addresses in a convenient area.  Remove car keys and lock garage doors so that the person does not try to get in the car and drive.  Have the person wear a bracelet that tracks locations and identifies the person as having memory  problems. This should be worn at all times for safety. Where to find support: Many individuals and organizations offer support. These include:  Support groups for people with dementia and for caregivers.  Counselors or therapists.  Home health care services.  Adult day care centers. Where to find more information Alzheimer's Association: CapitalMile.co.nz Contact a health care provider if:  The person's health is rapidly getting worse.  You are no longer able to care for the person.  Caring for the person is affecting your physical and emotional health.  The person threatens himself or herself, you, or anyone else. Summary  Dementia is a term used to describe a number of symptoms that affect memory and thinking.  Dementia usually gets worse slowly over time.  Take steps to reduce the person's risk of injury, and to  plan for future care.  Caregivers need support, relief from caregiving, and time for their own lives. This information is not intended to replace advice given to you by your health care provider. Make sure you discuss any questions you have with your health care provider. Document Revised: 07/26/2017 Document Reviewed: 07/17/2016 Elsevier Patient Education  2020 [employer].

## 2019-10-08 ENCOUNTER — Encounter: Payer: Self-pay | Admitting: Family Medicine

## 2019-10-14 DIAGNOSIS — E876 Hypokalemia: Secondary | ICD-10-CM | POA: Diagnosis not present

## 2019-10-14 DIAGNOSIS — R413 Other amnesia: Secondary | ICD-10-CM | POA: Diagnosis not present

## 2019-10-14 DIAGNOSIS — R7401 Elevation of levels of liver transaminase levels: Secondary | ICD-10-CM | POA: Diagnosis not present

## 2019-10-14 DIAGNOSIS — F418 Other specified anxiety disorders: Secondary | ICD-10-CM | POA: Diagnosis not present

## 2019-10-14 DIAGNOSIS — E039 Hypothyroidism, unspecified: Secondary | ICD-10-CM | POA: Diagnosis not present

## 2019-10-14 DIAGNOSIS — I10 Essential (primary) hypertension: Secondary | ICD-10-CM | POA: Diagnosis not present

## 2019-10-14 DIAGNOSIS — E7800 Pure hypercholesterolemia, unspecified: Secondary | ICD-10-CM | POA: Diagnosis not present

## 2019-10-14 DIAGNOSIS — R2681 Unsteadiness on feet: Secondary | ICD-10-CM | POA: Diagnosis not present

## 2019-10-22 ENCOUNTER — Ambulatory Visit: Payer: Medicare HMO | Attending: Internal Medicine

## 2019-10-22 DIAGNOSIS — Z23 Encounter for immunization: Secondary | ICD-10-CM | POA: Insufficient documentation

## 2019-10-22 NOTE — Progress Notes (Signed)
   [facility]ov[facility]ination [facility]  Name:  TANVIR RAKESTRAW    MRN: [id number] DOB: 26-Jun-1933  10/22/2019  Ms. Lennen was observed post Covid-19 immunization for 15 minutes without incidence. She was provided with Vaccine Information Sheet and instruction to access the V-Safe system.   Ms. Pershing was instructed to call 911 with any severe reactions post vaccine: Marland Kitchen Difficulty breathing  . Swelling of your face and throat  . A fast heartbeat  . A bad rash all over your body  . Dizziness and weakness    Immunizations Administered    Name Date Dose VIS Date Route   Pfizer COVID-19 Vaccine 10/22/2019 10:00 AM 0.3 mL 08/07/2019 Intramuscular   Manufacturer: [place]   Lot: [id number]   [place]: [id number]

## 2020-01-20 DIAGNOSIS — H52209 Unspecified astigmatism, unspecified eye: Secondary | ICD-10-CM | POA: Diagnosis not present

## 2020-01-20 DIAGNOSIS — H5203 Hypermetropia, bilateral: Secondary | ICD-10-CM | POA: Diagnosis not present

## 2020-01-20 DIAGNOSIS — H5212 Myopia, left eye: Secondary | ICD-10-CM | POA: Diagnosis not present

## 2020-01-20 DIAGNOSIS — H524 Presbyopia: Secondary | ICD-10-CM | POA: Diagnosis not present

## 2020-02-04 ENCOUNTER — Encounter: Payer: Self-pay | Admitting: Family Medicine

## 2020-02-04 ENCOUNTER — Other Ambulatory Visit: Payer: Self-pay

## 2020-02-04 ENCOUNTER — Ambulatory Visit: Payer: Medicare HMO | Admitting: Family Medicine

## 2020-02-04 VITALS — BP 162/70 | HR 61 | Ht 60.0 in | Wt 118.0 lb

## 2020-02-04 DIAGNOSIS — F0280 Dementia in other diseases classified elsewhere without behavioral disturbance: Secondary | ICD-10-CM | POA: Diagnosis not present

## 2020-02-04 DIAGNOSIS — G301 Alzheimer's disease with late onset: Secondary | ICD-10-CM | POA: Diagnosis not present

## 2020-02-04 DIAGNOSIS — D329 Benign neoplasm of meninges, unspecified: Secondary | ICD-10-CM | POA: Diagnosis not present

## 2020-02-04 MED ORDER — MEMANTINE HCL 10 MG PO TABS: 10.0000 mg | ORAL_TABLET | Freq: Two times a day (BID) | ORAL | 3 refills | Status: AC

## 2020-02-04 MED ORDER — MEMANTINE HCL 10 MG PO TABS: 10.0000 mg | ORAL_TABLET | Freq: Two times a day (BID) | ORAL | 3 refills | Status: DC

## 2020-02-04 NOTE — Patient Instructions (Addendum)
We will continue Aricept 10mg  daily. We will increase Namenda to 10mg  twice daily. I want you use your 5mg  tablets until you run out. Take 2 tablets in the morning and 1 tablet at night for 1 week. Then increase to 2 tablet twice daily. Once you run out, pick up new prescription for 10mg  tablets. Once you get new prescription you will take 1 tablet twice daily.   Follow up in 6 months   Memory Compensation Strategies  1. Use "WARM" strategy.  W= write it down  A= associate it  R= repeat it  M= make a mental note  2.   You can keep a Social worker.  Use a 3-ring notebook with sections for the following: calendar, important names and phone numbers,  medications, doctors' names/phone numbers, lists/reminders, and a section to journal what you did  each day.   3.    Use a calendar to write appointments down.  4.    Write yourself a schedule for the day.  This can be placed on the calendar or in a separate section of the Memory Notebook.  Keeping a  regular schedule can help memory.  5.    Use medication organizer with sections for each day or morning/evening pills.  You may need help loading it  6.    Keep a basket, or pegboard by the door.  Place items that you need to take out with you in the basket or on the pegboard.  You may also want to  include a message board for reminders.  7.    Use sticky notes.  Place sticky notes with reminders in a place where the task is performed.  For example: " turn off the  stove" placed by the stove, "lock the door" placed on the door at eye level, " take your medications" on  the bathroom mirror or by the place where you normally take your medications.  8.    Use alarms/timers.  Use while cooking to remind yourself to check on food or as a reminder to take your medicine, or as a  reminder to make a call, or as a reminder to perform another task, etc.   Dementia Dementia is a condition that affects the way the brain functions. It often affects  memory and thinking. Usually, dementia gets worse with time and cannot be reversed (progressive dementia). There are many types of dementia, including:  Alzheimer's disease. This type is the most common.  Vascular dementia. This type may happen as the result of a stroke.  Lewy body dementia. This type may happen to people who have Parkinson's disease.  Frontotemporal dementia. This type is caused by damage to nerve cells (neurons) in certain parts of the brain. Some people may be affected by more than one type of dementia. This is called mixed dementia. What are the causes? Dementia is caused by damage to cells in the brain. The area of the brain and the types of cells damaged determine the type of dementia. Usually, this damage is irreversible or cannot be undone. Some examples of irreversible causes include:  Conditions that affect the blood vessels of the brain, such as diabetes, heart disease, or blood vessel disease.  Genetic mutations. In some cases, changes in the brain may be caused by another condition and can be reversed or slowed. Some examples of reversible causes include:  Injury to the brain.  Certain medicines.  Infection, such as meningitis.  Metabolic problems, such as vitamin B12 deficiency  or thyroid disease.  Pressure on the brain, such as from a tumor or blood clot. What are the signs or symptoms? Symptoms of dementia depend on the type of dementia. Common signs of dementia include problems with remembering, thinking, problem solving, decision making, and communicating. These signs develop slowly or get worse with time. This may include:  Problems remembering things.  Having trouble taking a bath or putting clothes on.  Forgetting appointments.  Forgetting to pay bills.  Difficulty planning and preparing meals.  Having trouble speaking.  Getting lost easily. How is this diagnosed? This condition is diagnosed by a specialist (neurologist). It is  diagnosed based on the history of your symptoms, your medical history, a physical exam, and tests. Tests may include:  Tests to evaluate brain function, such as memory tests, cognitive tests, and other tests.  Lab tests, such as blood or urine tests.  Imaging tests, such as a CT scan, a PET scan, or an MRI.  Genetic testing. This may be done if other family members have a diagnosis of certain types of dementia. Your health care provider will talk with you and your family, friends, or caregivers about your history and symptoms. How is this treated?  Treatment for this condition depends on the cause of the dementia. Progressive dementias, such as Alzheimer's disease, cannot be cured, but there may be treatments that help to manage symptoms. Treatment might involve taking medicines that may help to:  Control the dementia.  Slow down the progression of the dementia.  Manage symptoms. In some cases, treating the cause of your dementia can improve symptoms, reverse symptoms, or slow down how quickly your dementia becomes worse. Your health care provider can direct you to support groups, organizations, and other health care providers who can help with decisions about your care. Follow these instructions at home: Medicines  Take over-the-counter and prescription medicines only as told by your health care provider.  Use a pill organizer or pill reminder to help you manage your medicines.  Avoid taking medicines that can affect thinking, such as pain medicines or sleeping medicines. Lifestyle  Make healthy lifestyle choices. ? Be physically active as told by your health care provider. ? Do not use any products that contain nicotine or tobacco, such as cigarettes, e-cigarettes, and chewing tobacco. If you need help quitting, ask your health care provider. ? Do not drink alcohol. ? Practice stress-management techniques when you get stressed. ? Spend time with other people.  Make sure to  get quality sleep. These tips can help you get a good night's rest: ? Avoid napping during the day. ? Keep your sleeping area dark and cool. ? Avoid exercising during the few hours before you go to bed. ? Avoid caffeine products in the evening. Eating and drinking  Drink enough fluid to keep your urine pale yellow.  Eat a healthy diet. General instructions   Work with your health care provider to determine what you need help with and what your safety needs are.  Talk with your health care provider about whether it is safe for you to drive.  If you were given a bracelet that identifies you as a person with memory loss or tracks your location, make sure to wear it at all times.  Work with your family to make important decisions, such as advance directives, medical power of attorney, or a living will.  Keep all follow-up visits as told by your health care provider. This is important. Where to find more information  Alzheimer's Association: [employer]Mile.co.nz  National Institute on Aging: DVDEnthusiasts.nl  World Health Organization: RoleLink.com.br Contact a health care provider if:  You have any new or worsening symptoms.  You have problems with choking or swallowing. Get help right away if:  You feel depressed or sad, or feel that you want to harm yourself.  Your family members become concerned for your safety. If you ever feel like you may hurt yourself or others, or have thoughts about taking your own life, get help right away. You can go to your nearest emergency department or call:  Your local emergency services (911 in the [place]  A suicide crisis helpline, such as the [place] at 419-264-9986. This is open 24 hours a day. Summary  Dementia is a condition that affects the way the brain functions. Dementia often affects memory and thinking.  Usually, dementia gets worse with time and cannot be reversed (progressive dementia).  Treatment  for this condition depends on the cause of the dementia.  Work with your health care provider to determine what you need help with and what your safety needs are.  Your health care provider can direct you to support groups, organizations, and other health care providers who can help with decisions about your care. This information is not intended to replace advice given to you by your health care provider. Make sure you discuss any questions you have with your health care provider. Document Revised: 10/28/2018 Document Reviewed: 10/28/2018 Elsevier Patient Education  [place].

## 2020-02-04 NOTE — Progress Notes (Addendum)
PATIENT: Alexandria Murphy DOB: Jul 13, 1933  REASON FOR VISIT: follow up HISTORY FROM: patient  Chief Complaint  Patient presents with  . Follow-up    Rm 1 here for a alzheimer's f/u.     HISTORY OF PRESENT ILLNESS: Today 02/04/20 Alexandria Murphy is a 86 y.o. female here today for follow up for dementia. She restarted Namenda in 09/2019. She is taking Namenda 5mg  BID and Aricept 10mg  daily. She has tolerated Namenda well. She is unsure if it has helped much. She continues to perform all ADL independently. She helps to care for her husband who is having some physical problems. He tells her what bills to pay and how much to write the check for but she is able to write check and mail. He helps her with medications. She is very active. She does not sit still. She does not drive much at all but has driven to the post office within walking distance from her home. She is feeling well today and without complaints.   CT in 2019 showed "2.2 cm anterior left frontal calcified meningioma". She denies any concerning symptoms to warrant follow up imaging.   HISTORY: (copied from my note on 10/07/2019)  Alexandria Murphy is a 86 y.o. female here today for follow up for dementia. We started Namenda 5mg  daily at last visit in 07/2019. She reports tolerating medication well. Unfortunately, her husband was admitted to the hospital for a fall and has not been home to refill her medication. She has been out of Namenda for a couple of weeks. She tolerated it well and wishes to restart. Otherwise, she is doing well.   HISTORY: (copied from my note on 08/16/2019)  Alexandria Murphy a 86 y.o.femalehere today for follow up for dementia. She continues Aricept and is tolerating without adverse effects. She feels that memory is fairly stable. Her husband presents with her today and states that she is a little more forgetful. She remains active. No falls. No assistive devices. She only drive to familiar, local places  with her husband in the car. She does not drive alone. Her husband can not see well. She is able to perform all ADL's. She does have a history of colitis.  HISTORY: (copied frommynote on 02/11/2019)  Alexandria Murphy a 86 y.o.femalehere today for follow up for memory loss.She is present today with her husband,, who aids in history. He reports that she is well. She states since her last. She is continuing Aricept 10 mg at bedtime and she states that her only concern is intermittent diarrhea. She does not feel that this is severe enough to stop the change but has noticed but it has continued she denies any concerns hydration. Is tolerating Aricept otherwise. She does continue to drive but is always accompanied by her husband. She denies any accidents or getting lost. She is able to dress and bathe herself she is feeling well today.   REVIEW OF SYSTEMS: Out of a complete 14 system review of symptoms, memory loss the patient complains only of the following symptoms, memory loss, anxiety and all other reviewed systems are negative.  ALLERGIES: Allergies  Allergen Reactions  . Cephalexin Hives  . Entocort Ec [Budesonide] Rash and Other (See Comments)    Elevated liver enzymes  . Lipitor [Atorvastatin] Rash and Other (See Comments)    Elevated liver enzymes  . Bee Venom   . Escitalopram     HOME MEDICATIONS: Outpatient Medications Prior to Visit  Medication Sig  Dispense Refill  . busPIRone (BUSPAR) 5 MG tablet Take 5 mg by mouth as needed.    . donepezil (ARICEPT) 10 MG tablet Take 1 tablet (10 mg total) by mouth at bedtime. 30 tablet 11  . hydrochlorothiazide (HYDRODIURIL) 25 MG tablet Take 25 mg by mouth daily.    Marland Kitchen levothyroxine (SYNTHROID, LEVOTHROID) 100 MCG tablet Take 100 mcg by mouth daily before breakfast.    . memantine (NAMENDA) 5 MG tablet Take 1 tablet (5 mg total) by mouth 2 (two) times daily. 180 tablet 3  . Potassium Chloride ER 20 MEQ TBCR     . Probiotic  Product (PROBIOTIC PO) Take by mouth daily.     No facility-administered medications prior to visit.    PAST MEDICAL HISTORY: Past Medical History:  Diagnosis Date  . Alzheimer disease ([place])   . Colitis    microscopic  . Diverticulosis   . Hypertension   . Hypothyroidism   . Kidney stones   . Liver hemangioma   . Lymphocytic colitis 02/2010  . Thyroid disease     PAST SURGICAL HISTORY: Past Surgical History:  Procedure Laterality Date  . BREAST CYST ASPIRATION  1982   Dr. Rebekah Chesterfield  . BREAST LUMPECTOMY Right   . EYE SURGERY Right 1950s  . FROZEN RIGHT SHOULDER Right 2006  . HYSTERECTOMY     complete per pt. PCP hx says for bleeding fibroid  . kidney stones    . UTEROSCOPY FOR NEPHROLITHIASIS  1981    FAMILY HISTORY: Family History  Problem Relation Age of Onset  . Heart attack Mother   . Heart disease Mother   . Arthritis Mother   . Other Father        killed on the job  . Throat cancer Brother   . Other Brother        ?heart problems-couldn't pass a physical to do any high school sports  . Emphysema Brother   . Alzheimer's disease Other        runs in the family     SOCIAL HISTORY: Social History   Socioeconomic History  . Marital status: Married    Spouse name: Not on file  . Number of children: 3  . Years of education: Not on file  . Highest education level: High school graduate  Occupational History  . Not on file  Tobacco Use  . Smoking status: Never Smoker  . Smokeless tobacco: Never Used  Vaping Use  . Vaping Use: Never used  Substance and Sexual Activity  . Alcohol use: Never  . Drug use: Never  . Sexual activity: Not on file  Other Topics Concern  . Not on file  Social History Narrative   Lives at home with her husband Homer   Right handed   Caffeine: 1 can of mtn dew daily   Social Determinants of Health   Financial Resource Strain:   . Difficulty of Paying Living Expenses:   Food Insecurity:   . Worried About Charity fundraiser  in the Last Year:   . Arboriculturist in the Last Year:   Transportation Needs:   . Film/video editor (Medical):   Marland Kitchen Lack of Transportation (Non-Medical):   Physical Activity:   . Days of Exercise per Week:   . Minutes of Exercise per Session:   Stress:   . Feeling of Stress :   Social Connections:   . Frequency of Communication with Friends and Family:   . Frequency of Social  Gatherings with Friends and Family:   . Attends Religious Services:   . Active Member of Clubs or Organizations:   . Attends Archivist Meetings:   Marland Kitchen Marital Status:   Intimate Partner Violence:   . Fear of Current or Ex-Partner:   . Emotionally Abused:   Marland Kitchen Physically Abused:   . Sexually Abused:       PHYSICAL EXAM  Vitals:   02/04/20 1325  BP: (!) 162/70  Pulse: 61  Weight: 118 lb (53.5 kg)  Height: 5' (1.524 m)   Body mass index is 23.05 kg/m.  Generalized: Well developed, in no acute distress  Cardiology: normal rate and rhythm, no murmur noted Respiratory: clear to auscultation bilaterally  Neurological examination  Mentation: Alert oriented to time, place, history taking. Follows all commands speech and language fluent Cranial nerve II-XII: Pupils were equal round reactive to light. Extraocular movements were full, visual field were full on confrontational test. Facial sensation and strength were normal. Head turning and shoulder shrug  were normal and symmetric. Motor: The motor testing reveals 5 over 5 strength of all 4 extremities. Good symmetric motor tone is noted throughout.   Gait and station: Gait is normal.   DIAGNOSTIC DATA (LABS, IMAGING, TESTING) - I reviewed patient records, labs, notes, testing and imaging myself where available.  MMSE - Mini Mental State Exam 02/04/2020 10/07/2019 08/13/2019  Not completed: - (No Data) -  Orientation to time 2 3 3   Orientation to Place 3 3 4   Registration 3 3 3   Attention/ Calculation 2 1 1   Recall 1 3 1   Language- name  2 objects 2 2 2   Language- repeat 1 0 1  Language- follow 3 step command 3 3 3   Language- read & follow direction 1 1 1   Write a sentence 1 1 1   Copy design 1 1 1   Copy design-comments - - named 5 animals  Total score 20 21 21      Lab Results  Component Value Date   WBC 10.6 (H) 07/23/2019   HGB 12.8 07/23/2019   HCT 37.8 07/23/2019   MCV 88.1 07/23/2019   PLT 215 07/23/2019      Component Value Date/Time   NA 134 (L) 07/23/2019 2108   K 2.8 (L) 07/23/2019 2108   CL 95 (L) 07/23/2019 2108   CO2 29 07/23/2019 2108   GLUCOSE 115 (H) 07/23/2019 2108   BUN 22 07/23/2019 2108   CREATININE 1.10 (H) 07/23/2019 2108   CALCIUM 9.2 07/23/2019 2108   PROT 6.9 07/23/2019 2108   ALBUMIN 4.0 07/23/2019 2108   AST 38 07/23/2019 2108   ALT 13 07/23/2019 2108   ALKPHOS 62 07/23/2019 2108   BILITOT 0.7 07/23/2019 2108   GFRNONAA 46 (L) 07/23/2019 2108   GFRAA 53 (L) 07/23/2019 2108   No results found for: CHOL, HDL, LDLCALC, LDLDIRECT, TRIG, CHOLHDL No results found for: HGBA1C Lab Results  Component Value Date   VITAMINB12 1,176 02/05/2018   No results found for: TSH     ASSESSMENT AND PLAN 86 y.o. year old female  has a past medical history of Alzheimer disease ([place]), Colitis, Diverticulosis, Hypertension, Hypothyroidism, Kidney stones, Liver hemangioma, Lymphocytic colitis (02/2010), and Thyroid disease. here with     ICD-10-CM   1. Late onset Alzheimer's disease without behavioral disturbance (HCC)  G30.1    F02.80   2. Meningioma ([place])  D32.9     Anahlia is doing well today.  She has tolerated Namenda and Aricept.  We will continue Aricept 10 mg daily.  I will increase Namenda to 10 mg twice daily.  I have given her written instructions on how to increase this medication so that she may show instructions to her husband who helps dose medications.  Her daughter is with her today and also verbalizes understanding of how to increase this medication.  She will use her 5 mg  tablets until she runs out.  She will take 2 tablets for a total of 2 mg in the morning and 1 tablet in the evenings for 1 week then increase to 2 tablets twice daily.  She is aware to pick up her new prescription once she runs out of current medication.  She will then take 1 tablet twice daily.  We have discussed concerns of anxiety.  MMSE is stable today.  After completion of her visit, I reviewed some of the answers that were missed.  She was able to answer these correctly.  Blood pressure is elevated as well.  She reports whitecoat syndrome.  We have discussed role of anxiety and worsening memory.  She was encouraged to stay active.  Memory compensation strategies reviewed.  Adequate hydration and well-balanced diet.  She will follow-up with me in 6 months, sooner if needed.  She verbalizes understanding and agreement with this plan.   No orders of the defined types were placed in this encounter.    Meds ordered this encounter  Medications  . DISCONTD: memantine (NAMENDA) 10 MG tablet    Sig: Take 1 tablet (10 mg total) by mouth 2 (two) times daily.    Dispense:  180 tablet    Refill:  3    Order Specific Question:   Supervising Provider    Answer:   Melvenia Beam [id number]  . memantine (NAMENDA) 10 MG tablet    Sig: Take 1 tablet (10 mg total) by mouth 2 (two) times daily.    Dispense:  180 tablet    Refill:  3    Order Specific Question:   Supervising Provider    Answer:   Melvenia Beam [id number]      I spent 15 minutes with the patient. 50% of this time was spent counseling and educating patient on plan of care and medications.    Debbora Presto, FNP-C 02/04/2020, 1:57 PM [facility] [address], [place], [place] [address] [phone]  Made any corrections needed, and agree with history, physical, neuro exam,assessment and plan as stated.     Sarina Ill, MD [facility]

## 2020-04-05 DIAGNOSIS — E7800 Pure hypercholesterolemia, unspecified: Secondary | ICD-10-CM | POA: Diagnosis not present

## 2020-04-05 DIAGNOSIS — E039 Hypothyroidism, unspecified: Secondary | ICD-10-CM | POA: Diagnosis not present

## 2020-04-05 DIAGNOSIS — E7849 Other hyperlipidemia: Secondary | ICD-10-CM | POA: Diagnosis not present

## 2020-04-05 DIAGNOSIS — I10 Essential (primary) hypertension: Secondary | ICD-10-CM | POA: Diagnosis not present

## 2020-04-12 DIAGNOSIS — I10 Essential (primary) hypertension: Secondary | ICD-10-CM | POA: Diagnosis not present

## 2020-04-12 DIAGNOSIS — E039 Hypothyroidism, unspecified: Secondary | ICD-10-CM | POA: Diagnosis not present

## 2020-04-12 DIAGNOSIS — R413 Other amnesia: Secondary | ICD-10-CM | POA: Diagnosis not present

## 2020-04-12 DIAGNOSIS — Z0000 Encounter for general adult medical examination without abnormal findings: Secondary | ICD-10-CM | POA: Diagnosis not present

## 2020-05-31 ENCOUNTER — Telehealth: Payer: Self-pay | Admitting: Family Medicine

## 2020-05-31 NOTE — Telephone Encounter (Signed)
Pt's daughter, Briyanna Billingham (on [place]) called, Pt quit taking memantine (NAMENDA) 10 MG tablet. After husband passed away she was not able to keep track of when to take her medication. Can she start taking her memantine (NAMENDA) 10 MG tablet again? Would like a call back.

## 2020-05-31 NOTE — Telephone Encounter (Signed)
Called Pam and informed her of NP's message. She  verbalized understanding, appreciation.

## 2020-05-31 NOTE — Telephone Encounter (Signed)
She can. I would start back at 10mg  daily for 1-2 weeks then increase to 10mg  twice daily if well tolerated.

## 2020-08-09 ENCOUNTER — Encounter: Payer: Self-pay | Admitting: Family Medicine

## 2020-08-09 ENCOUNTER — Ambulatory Visit: Payer: Medicare HMO | Admitting: Family Medicine

## 2020-08-09 NOTE — Progress Notes (Deleted)
No chief complaint on file.    HISTORY OF PRESENT ILLNESS: Today 08/09/20  Alexandria Murphy is a 86 y.o. female here today for follow up for for dementia. She continues Aricept and Namenda.    HISTORY (copied from previous note)  Alexandria Murphy is a 86 y.o. female here today for follow up for dementia. She restarted Namenda in 09/2019. She is taking Namenda 5mg  BID and Aricept 10mg  daily. She has tolerated Namenda well. She is unsure if it has helped much. She continues to perform all ADL independently. She helps to care for her husband who is having some physical problems. He tells her what bills to pay and how much to write the check for but she is able to write check and mail. He helps her with medications. She is very active. She does not sit still. She does not drive much at all but has driven to the post office within walking distance from her home. She is feeling well today and without complaints.   CT in 2019 showed "2.2 cm anterior left frontal calcified meningioma". She denies any concerning symptoms to warrant follow up imaging.    REVIEW OF SYSTEMS: Out of a complete 14 system review of symptoms, the patient complains only of the following symptoms, and all other reviewed systems are negative.   ALLERGIES: Allergies  Allergen Reactions  . Cephalexin Hives  . Entocort Ec [Budesonide] Rash and Other (See Comments)    Elevated liver enzymes  . Lipitor [Atorvastatin] Rash and Other (See Comments)    Elevated liver enzymes  . Bee Venom   . Escitalopram      HOME MEDICATIONS: Outpatient Medications Prior to Visit  Medication Sig Dispense Refill  . busPIRone (BUSPAR) 5 MG tablet Take 5 mg by mouth as needed.    . donepezil (ARICEPT) 10 MG tablet Take 1 tablet (10 mg total) by mouth at bedtime. 30 tablet 11  . hydrochlorothiazide (HYDRODIURIL) 25 MG tablet Take 25 mg by mouth daily.    Marland Kitchen levothyroxine (SYNTHROID, LEVOTHROID) 100 MCG tablet Take 100 mcg by mouth daily  before breakfast.    . memantine (NAMENDA) 10 MG tablet Take 1 tablet (10 mg total) by mouth 2 (two) times daily. 180 tablet 3   No facility-administered medications prior to visit.     PAST MEDICAL HISTORY: Past Medical History:  Diagnosis Date  . Alzheimer disease ([place])   . Colitis    microscopic  . Diverticulosis   . Hypertension   . Hypothyroidism   . Kidney stones   . Liver hemangioma   . Lymphocytic colitis 02/2010  . Thyroid disease      PAST SURGICAL HISTORY: Past Surgical History:  Procedure Laterality Date  . BREAST CYST ASPIRATION  1982   Dr. Rebekah Chesterfield  . BREAST LUMPECTOMY Right   . EYE SURGERY Right 1950s  . FROZEN RIGHT SHOULDER Right 2006  . HYSTERECTOMY     complete per pt. PCP hx says for bleeding fibroid  . kidney stones    . UTEROSCOPY FOR NEPHROLITHIASIS  1981     FAMILY HISTORY: Family History  Problem Relation Age of Onset  . Heart attack Mother   . Heart disease Mother   . Arthritis Mother   . Other Father        killed on the job  . Throat cancer Brother   . Other Brother        ?heart problems-couldn't pass a physical to do any high school  sports  . Emphysema Brother   . Alzheimer's disease Other        runs in the family      SOCIAL HISTORY: Social History   Socioeconomic History  . Marital status: Married    Spouse name: Not on file  . Number of children: 3  . Years of education: Not on file  . Highest education level: High school graduate  Occupational History  . Not on file  Tobacco Use  . Smoking status: Never Smoker  . Smokeless tobacco: Never Used  Vaping Use  . Vaping Use: Never used  Substance and Sexual Activity  . Alcohol use: Never  . Drug use: Never  . Sexual activity: Not on file  Other Topics Concern  . Not on file  Social History Narrative   Lives at home with her husband Homer   Right handed   Caffeine: 1 can of mtn dew daily   Social Determinants of Health   Financial Resource Strain: Not on file   Food Insecurity: Not on file  Transportation Needs: Not on file  Physical Activity: Not on file  Stress: Not on file  Social Connections: Not on file  Intimate Partner Violence: Not on file      PHYSICAL EXAM  There were no vitals filed for this visit. There is no height or weight on file to calculate BMI.   Generalized: Well developed, in no acute distress  Cardiology: normal rate and rhythm, no murmur auscultated  Respiratory: clear to auscultation bilaterally    Neurological examination  Mentation: Alert oriented to time, place, history taking. Follows all commands speech and language fluent Cranial nerve II-XII: Pupils were equal round reactive to light. Extraocular movements were full, visual field were full on confrontational test. Facial sensation and strength were normal. Uvula tongue midline. Head turning and shoulder shrug  were normal and symmetric. Motor: The motor testing reveals 5 over 5 strength of all 4 extremities. Good symmetric motor tone is noted throughout.  Sensory: Sensory testing is intact to soft touch on all 4 extremities. No evidence of extinction is noted.  Coordination: Cerebellar testing reveals good finger-nose-finger and heel-to-shin bilaterally.  Gait and station: Gait is normal. Tandem gait is normal. Romberg is negative. No drift is seen.  Reflexes: Deep tendon reflexes are symmetric and normal bilaterally.     DIAGNOSTIC DATA (LABS, IMAGING, TESTING) - I reviewed patient records, labs, notes, testing and imaging myself where available.  Lab Results  Component Value Date   WBC 10.6 (H) 07/23/2019   HGB 12.8 07/23/2019   HCT 37.8 07/23/2019   MCV 88.1 07/23/2019   PLT 215 07/23/2019      Component Value Date/Time   NA 134 (L) 07/23/2019 2108   K 2.8 (L) 07/23/2019 2108   CL 95 (L) 07/23/2019 2108   CO2 29 07/23/2019 2108   GLUCOSE 115 (H) 07/23/2019 2108   BUN 22 07/23/2019 2108   CREATININE 1.10 (H) 07/23/2019 2108   CALCIUM 9.2  07/23/2019 2108   PROT 6.9 07/23/2019 2108   ALBUMIN 4.0 07/23/2019 2108   AST 38 07/23/2019 2108   ALT 13 07/23/2019 2108   ALKPHOS 62 07/23/2019 2108   BILITOT 0.7 07/23/2019 2108   GFRNONAA 46 (L) 07/23/2019 2108   GFRAA 53 (L) 07/23/2019 2108   No results found for: CHOL, HDL, LDLCALC, LDLDIRECT, TRIG, CHOLHDL No results found for: HGBA1C Lab Results  Component Value Date   VITAMINB12 1,176 02/05/2018   No results found for:  TSH  MMSE - Mini Mental State Exam 02/04/2020 10/07/2019 08/13/2019  Not completed: - (No Data) -  Orientation to time 2 3 3   Orientation to Place 3 3 4   Registration 3 3 3   Attention/ Calculation 2 1 1   Recall 1 3 1   Language- name 2 objects 2 2 2   Language- repeat 1 0 1  Language- follow 3 step command 3 3 3   Language- read & follow direction 1 1 1   Write a sentence 1 1 1   Copy design 1 1 1   Copy design-comments - - named 5 animals  Total score 20 21 21      ASSESSMENT AND PLAN  86 y.o. year old female  has a past medical history of Alzheimer disease ([place]), Colitis, Diverticulosis, Hypertension, Hypothyroidism, Kidney stones, Liver hemangioma, Lymphocytic colitis (02/2010), and Thyroid disease. here with   No diagnosis found.    No orders of the defined types were placed in this encounter.     I spent 20 minutes of face-to-face and non-face-to-face time with patient.  This included previsit chart review, lab review, study review, order entry, electronic health record documentation, patient education.    Debbora Presto, MSN, FNP-C 08/09/2020, 12:00 PM  [facility] [address], [place], [place] [address] [phone]

## 2020-10-06 DIAGNOSIS — I10 Essential (primary) hypertension: Secondary | ICD-10-CM | POA: Diagnosis not present

## 2020-10-06 DIAGNOSIS — E039 Hypothyroidism, unspecified: Secondary | ICD-10-CM | POA: Diagnosis not present

## 2020-10-13 DIAGNOSIS — I10 Essential (primary) hypertension: Secondary | ICD-10-CM | POA: Diagnosis not present

## 2020-10-13 DIAGNOSIS — E039 Hypothyroidism, unspecified: Secondary | ICD-10-CM | POA: Diagnosis not present

## 2020-10-13 DIAGNOSIS — R7309 Other abnormal glucose: Secondary | ICD-10-CM | POA: Diagnosis not present

## 2020-10-13 DIAGNOSIS — Z23 Encounter for immunization: Secondary | ICD-10-CM | POA: Diagnosis not present

## 2020-10-13 DIAGNOSIS — R413 Other amnesia: Secondary | ICD-10-CM | POA: Diagnosis not present

## 2020-11-05 DIAGNOSIS — R069 Unspecified abnormalities of breathing: Secondary | ICD-10-CM | POA: Diagnosis not present

## 2020-11-05 DIAGNOSIS — Z87898 Personal history of other specified conditions: Secondary | ICD-10-CM | POA: Diagnosis not present

## 2020-11-05 DIAGNOSIS — R9431 Abnormal electrocardiogram [ECG] [EKG]: Secondary | ICD-10-CM | POA: Diagnosis not present

## 2020-11-05 DIAGNOSIS — R42 Dizziness and giddiness: Secondary | ICD-10-CM | POA: Diagnosis not present

## 2020-11-05 DIAGNOSIS — I959 Hypotension, unspecified: Secondary | ICD-10-CM | POA: Diagnosis not present

## 2020-11-05 DIAGNOSIS — D329 Benign neoplasm of meninges, unspecified: Secondary | ICD-10-CM | POA: Diagnosis not present

## 2020-11-05 DIAGNOSIS — R0902 Hypoxemia: Secondary | ICD-10-CM | POA: Diagnosis not present

## 2020-11-05 DIAGNOSIS — I498 Other specified cardiac arrhythmias: Secondary | ICD-10-CM | POA: Diagnosis not present

## 2020-11-05 DIAGNOSIS — I10 Essential (primary) hypertension: Secondary | ICD-10-CM | POA: Diagnosis not present

## 2020-11-05 DIAGNOSIS — E876 Hypokalemia: Secondary | ICD-10-CM | POA: Diagnosis not present

## 2020-11-06 DIAGNOSIS — R9431 Abnormal electrocardiogram [ECG] [EKG]: Secondary | ICD-10-CM | POA: Diagnosis not present

## 2021-04-11 DIAGNOSIS — R7309 Other abnormal glucose: Secondary | ICD-10-CM | POA: Diagnosis not present

## 2021-04-11 DIAGNOSIS — E7849 Other hyperlipidemia: Secondary | ICD-10-CM | POA: Diagnosis not present

## 2021-04-11 DIAGNOSIS — E559 Vitamin D deficiency, unspecified: Secondary | ICD-10-CM | POA: Diagnosis not present

## 2021-04-11 DIAGNOSIS — E039 Hypothyroidism, unspecified: Secondary | ICD-10-CM | POA: Diagnosis not present

## 2021-04-11 DIAGNOSIS — I10 Essential (primary) hypertension: Secondary | ICD-10-CM | POA: Diagnosis not present

## 2021-04-18 DIAGNOSIS — Z8719 Personal history of other diseases of the digestive system: Secondary | ICD-10-CM | POA: Diagnosis not present

## 2021-04-18 DIAGNOSIS — I10 Essential (primary) hypertension: Secondary | ICD-10-CM | POA: Diagnosis not present

## 2021-04-18 DIAGNOSIS — R413 Other amnesia: Secondary | ICD-10-CM | POA: Diagnosis not present

## 2021-04-18 DIAGNOSIS — Z23 Encounter for immunization: Secondary | ICD-10-CM | POA: Diagnosis not present

## 2021-04-18 DIAGNOSIS — Z0000 Encounter for general adult medical examination without abnormal findings: Secondary | ICD-10-CM | POA: Diagnosis not present

## 2021-04-18 DIAGNOSIS — E039 Hypothyroidism, unspecified: Secondary | ICD-10-CM | POA: Diagnosis not present

## 2021-07-05 DIAGNOSIS — R413 Other amnesia: Secondary | ICD-10-CM | POA: Diagnosis not present

## 2021-07-05 DIAGNOSIS — Z8719 Personal history of other diseases of the digestive system: Secondary | ICD-10-CM | POA: Diagnosis not present

## 2021-07-05 DIAGNOSIS — J309 Allergic rhinitis, unspecified: Secondary | ICD-10-CM | POA: Diagnosis not present

## 2021-08-04 DIAGNOSIS — H5203 Hypermetropia, bilateral: Secondary | ICD-10-CM | POA: Diagnosis not present

## 2021-08-16 ENCOUNTER — Other Ambulatory Visit: Payer: Self-pay

## 2021-08-16 ENCOUNTER — Other Ambulatory Visit (HOSPITAL_BASED_OUTPATIENT_CLINIC_OR_DEPARTMENT_OTHER): Payer: Self-pay

## 2021-08-16 ENCOUNTER — Encounter (HOSPITAL_BASED_OUTPATIENT_CLINIC_OR_DEPARTMENT_OTHER): Payer: Self-pay

## 2021-08-16 ENCOUNTER — Emergency Department (HOSPITAL_BASED_OUTPATIENT_CLINIC_OR_DEPARTMENT_OTHER)
Admission: EM | Admit: 2021-08-16 | Discharge: 2021-08-16 | Disposition: A | Discharge status: Home / Self Care | Payer: Medicare HMO | Attending: Emergency Medicine | Admitting: Emergency Medicine

## 2021-08-16 DIAGNOSIS — G309 Alzheimer's disease, unspecified: Secondary | ICD-10-CM | POA: Diagnosis not present

## 2021-08-16 DIAGNOSIS — R21 Rash and other nonspecific skin eruption: Secondary | ICD-10-CM | POA: Diagnosis not present

## 2021-08-16 DIAGNOSIS — Z79899 Other long term (current) drug therapy: Secondary | ICD-10-CM | POA: Diagnosis not present

## 2021-08-16 DIAGNOSIS — E039 Hypothyroidism, unspecified: Secondary | ICD-10-CM | POA: Insufficient documentation

## 2021-08-16 DIAGNOSIS — L509 Urticaria, unspecified: Secondary | ICD-10-CM | POA: Diagnosis not present

## 2021-08-16 DIAGNOSIS — I10 Essential (primary) hypertension: Secondary | ICD-10-CM | POA: Diagnosis not present

## 2021-08-16 DIAGNOSIS — L299 Pruritus, unspecified: Secondary | ICD-10-CM | POA: Diagnosis not present

## 2021-08-16 MED ORDER — PREDNISONE 20 MG PO TABS
20.0000 mg | ORAL_TABLET | Freq: Every day | ORAL | 0 refills | Status: AC
  Filled 2021-08-16: qty 3, 3d supply, fill #0

## 2021-08-16 MED ORDER — PREDNISONE 50 MG PO TABS
60.0000 mg | ORAL_TABLET | Freq: Once | ORAL | Status: AC
  Administered 2021-08-16: 10:00:00 60 mg via ORAL
  Filled 2021-08-16: qty 1

## 2021-08-16 NOTE — ED Provider Notes (Signed)
[facility] EMERGENC[facility] DEPARTMENT Provider Note   CSN: [id number] Arrival date & time: 08/16/21  3559     History Chief Complaint  Patient presents with   Urticaria    Alexandria Murphy is a 87 y.o. female.  Hives to her back since yesterday.  No new medications.  No allergy exposure.  No trouble with breathing or nausea or vomiting or diarrhea.  The history is provided by the patient.  Urticaria This is a new problem. The current episode started yesterday. The problem occurs constantly. The problem has not changed since onset.Pertinent negatives include no chest pain. Nothing aggravates the symptoms. Nothing relieves the symptoms. She has tried nothing for the symptoms. The treatment provided no relief.      Past Medical History:  Diagnosis Date   Alzheimer disease ([place])    Colitis    microscopic   Diverticulosis    Hypertension    Hypothyroidism    Kidney stones    Liver hemangioma    Lymphocytic colitis 02/2010   Thyroid disease     Patient Active Problem List   Diagnosis Date Noted   Alzheimer disease ([place]) 02/05/2018   Meningioma ([place]) 02/05/2018    Past Surgical History:  Procedure Laterality Date   BREAST CYST ASPIRATION  1982   Dr. Rebekah [place]   BREAST LUMPECTOMY Right    EYE SURGERY Right 1950s   FROZEN RIGHT SHOULDER Right 2006   HYSTERECTOMY     complete per pt. PCP hx says for bleeding fibroid   kidney stones     UTEROSCOPY FOR NEPHROLITHIASIS  1981     OB History   No obstetric history on file.     Family History  Problem Relation Age of Onset   Heart attack Mother    Heart disease Mother    Arthritis Mother    Other Father        killed on the job   Throat cancer Brother    Other Brother        ?heart problems-couldn't pass a physical to do any high school sports   Emphysema Brother    Alzheimer's disease Other        runs in the family     Social History   Tobacco Use   Smoking status: Never   Smokeless tobacco: Never   Vaping Use   Vaping Use: Never used  Substance Use Topics   Alcohol use: Never   Drug use: Never    Home Medications Prior to Admission medications   Medication Sig Start Date End Date Taking? Authorizing Provider  predniSONE (DELTASONE) 20 MG tablet Take 1 tablet (20 mg total) by mouth daily for 3 days. 08/16/21 08/19/21 Yes Jhoana Upham, DO  busPIRone (BUSPAR) 5 MG tablet Take 5 mg by mouth as needed.    [provider]  donepezil (ARICEPT) 10 MG tablet Take 1 tablet (10 mg total) by mouth at bedtime. 02/05/18   Melvenia Beam, MD  hydrochlorothiazide (HYDRODIURIL) 25 MG tablet Take 25 mg by mouth daily.    [provider]  levothyroxine (SYNTHROID, LEVOTHROID) 100 MCG tablet Take 100 mcg by mouth daily before breakfast.    [provider]  memantine (NAMENDA) 10 MG tablet Take 1 tablet (10 mg total) by mouth 2 (two) times daily. 02/04/20   Lomax, Amy, NP    Allergies    Cephalexin, Entocort ec [budesonide], Lipitor [atorvastatin], Bee venom, and Escitalopram  Review of Systems   Review of Systems  Constitutional:  Negative for chills and fever.  HENT:  Negative for facial swelling and trouble swallowing.   Cardiovascular:  Negative for chest pain.  Gastrointestinal:  Negative for diarrhea, nausea and vomiting.  Skin:  Positive for rash.  Neurological:  Negative for weakness and numbness.   Physical Exam Updated Vital Signs BP (!) 167/70 (BP Location: Right Arm)    Pulse 73    Temp (!) 97.5 F (36.4 C) (Oral)    Resp 18    Ht 5' (1.524 m)    Wt 56.7 kg    SpO2 100%    BMI 24.41 kg/m   Physical Exam Constitutional:      General: She is not in acute distress.    Appearance: She is not ill-appearing.  HENT:     Head: Normocephalic.     Nose: Nose normal.     Mouth/Throat:     Mouth: Mucous membranes are moist.     Pharynx: No oropharyngeal exudate or posterior oropharyngeal erythema.  Eyes:     Pupils: Pupils are equal, round, and reactive  to light.  Cardiovascular:     Pulses: Normal pulses.  Skin:    General: Skin is warm.     Capillary Refill: Capillary refill takes less than 2 seconds.     Findings: Rash present.  Neurological:     General: No focal deficit present.     Mental Status: She is alert.    ED Results / Procedures / Treatments   Labs (all labs ordered are listed, but only abnormal results are displayed) Labs Reviewed - No data to display  EKG None  Radiology No results found.  Procedures Procedures   Medications Ordered in ED Medications  predniSONE (DELTASONE) tablet 60 mg (has no administration in time range)    ED Course  I have reviewed the triage vital signs and the nursing notes.  Pertinent labs & imaging results that were available during my care of the patient were reviewed by me and considered in my medical decision making (see chart for details).    MDM Rules/Calculators/A&P                          Alexandria Murphy is here with rash.  Overall unremarkable vitals.  No fever.  Overall nonspecific hives throughout her back.  No signs of anaphylaxis.  No respiratory distress.  Very well-appearing.  Does not have the appearance of shingles or other life-threatening rash.  Will put her on a short steroid burst.  Recommend Benadryl as well.  Discharged in good condition.  Recommend follow-up with primary care doctor.  This chart was dictated using voice recognition software.  Despite best efforts to proofread,  errors can occur which can change the documentation meaning.      Final Clinical Impression(s) / ED Diagnoses Final diagnoses:  Rash    Rx / DC Orders ED Discharge Orders          Ordered    predniSONE (DELTASONE) 20 MG tablet  Daily        08/16/21 1012             [place], DO 08/16/21 1014

## 2021-08-16 NOTE — Discharge Instructions (Signed)
Take 25 mg of Benadryl every 6-8 hours as needed for itchiness.  Take next dose of steroid tomorrow.  Follow-up with your primary care doctor.

## 2021-08-16 NOTE — ED Triage Notes (Signed)
Pt c/o rash to back, chest and abdomen since yesterday, denies new medication or known causes. States "happens every now and then". Denies SOB/trouble swallowing.

## 2021-09-21 DIAGNOSIS — L299 Pruritus, unspecified: Secondary | ICD-10-CM | POA: Diagnosis not present

## 2021-09-21 DIAGNOSIS — R413 Other amnesia: Secondary | ICD-10-CM | POA: Diagnosis not present

## 2021-09-21 DIAGNOSIS — L509 Urticaria, unspecified: Secondary | ICD-10-CM | POA: Diagnosis not present

## 2021-10-19 DIAGNOSIS — K52832 Lymphocytic colitis: Secondary | ICD-10-CM | POA: Diagnosis not present

## 2021-10-26 DIAGNOSIS — R413 Other amnesia: Secondary | ICD-10-CM | POA: Diagnosis not present

## 2021-10-26 DIAGNOSIS — Z8719 Personal history of other diseases of the digestive system: Secondary | ICD-10-CM | POA: Diagnosis not present

## 2021-10-26 DIAGNOSIS — I10 Essential (primary) hypertension: Secondary | ICD-10-CM | POA: Diagnosis not present

## 2021-10-26 DIAGNOSIS — E039 Hypothyroidism, unspecified: Secondary | ICD-10-CM | POA: Diagnosis not present

## 2021-10-26 DIAGNOSIS — E559 Vitamin D deficiency, unspecified: Secondary | ICD-10-CM | POA: Diagnosis not present

## 2021-11-07 DIAGNOSIS — R21 Rash and other nonspecific skin eruption: Secondary | ICD-10-CM | POA: Diagnosis not present

## 2021-11-22 DIAGNOSIS — L508 Other urticaria: Secondary | ICD-10-CM | POA: Diagnosis not present

## 2021-12-06 ENCOUNTER — Other Ambulatory Visit: Payer: Self-pay

## 2021-12-06 ENCOUNTER — Encounter (HOSPITAL_BASED_OUTPATIENT_CLINIC_OR_DEPARTMENT_OTHER): Payer: Self-pay

## 2021-12-06 ENCOUNTER — Emergency Department (HOSPITAL_BASED_OUTPATIENT_CLINIC_OR_DEPARTMENT_OTHER)
Admission: EM | Admit: 2021-12-06 | Discharge: 2021-12-06 | Disposition: A | Discharge status: Home / Self Care | Payer: Medicare HMO | Attending: Emergency Medicine | Admitting: Emergency Medicine

## 2021-12-06 DIAGNOSIS — E039 Hypothyroidism, unspecified: Secondary | ICD-10-CM | POA: Diagnosis not present

## 2021-12-06 DIAGNOSIS — R21 Rash and other nonspecific skin eruption: Secondary | ICD-10-CM | POA: Insufficient documentation

## 2021-12-06 DIAGNOSIS — G309 Alzheimer's disease, unspecified: Secondary | ICD-10-CM | POA: Insufficient documentation

## 2021-12-06 DIAGNOSIS — Z79899 Other long term (current) drug therapy: Secondary | ICD-10-CM | POA: Diagnosis not present

## 2021-12-06 DIAGNOSIS — I10 Essential (primary) hypertension: Secondary | ICD-10-CM | POA: Diagnosis not present

## 2021-12-06 MED ORDER — PREDNISONE 50 MG PO TABS
60.0000 mg | ORAL_TABLET | Freq: Once | ORAL | Status: AC
  Administered 2021-12-06: 60 mg via ORAL
  Filled 2021-12-06: qty 1

## 2021-12-06 MED ORDER — PREDNISONE 10 MG PO TABS: 20.0000 mg | ORAL_TABLET | Freq: Every day | ORAL | 0 refills | Status: AC

## 2021-12-06 NOTE — Discharge Instructions (Signed)
Please take Prednisone as prescribed. I gave you your initial dose here in the ED, so do not start taking until tomorrow. Please follow up with primary care doctor regarding future care. ?

## 2021-12-06 NOTE — ED Provider Notes (Signed)
?[facility] EMERGENCY DEPARTMENT ?Provider Note ? ? ?CSN: [id number] ?Arrival date & time: 12/06/21  1050 ? ?  ? ?History ?PMH: Alzheimer Disease, chronic urticaria, hypothyroidism, HTN ?Chief Complaint  ?Patient presents with  ? Rash  ? ? ?Alexandria Murphy is a 88 y.o. female. ?She presents with a chief complaint of rash.  She is accompanied with her daughter who states that she has been dealing with this rash intermittently for several years.  She is usually followed by her primary care, but has had come to the emergency department a couple of times over the past year.  Typically the rash improves with steroid injections or oral medication.  She is currently trying to get established with a allergist. ?Patient states this current episode of rash started this morning and has had extreme itching.  She has tried the triamcinolone cream today without any relief.  Daughter states that patient was crying and asked her to come over and that is why she ended up coming here.  They originally tried to contact their PCP, but have not heard back yet. ?No known triggers to these urticarial rashes.  Denies any other symptoms. ? ? ?Rash ? ? ?  ? ?Home Medications ?Prior to Admission medications   ?Medication Sig Start Date End Date Taking? Authorizing Provider  ?predniSONE (DELTASONE) 10 MG tablet Take 2 tablets (20 mg total) by mouth daily for 6 days. 12/06/21 12/12/21 Yes Asanti Craigo, Adora Fridge, PA-C  ?busPIRone (BUSPAR) 5 MG tablet Take 5 mg by mouth as needed.    [provider]  ?donepezil (ARICEPT) 10 MG tablet Take 1 tablet (10 mg total) by mouth at bedtime. 02/05/18   Melvenia Beam, MD  ?hydrochlorothiazide (HYDRODIURIL) 25 MG tablet Take 25 mg by mouth daily.    [provider]  ?levothyroxine (SYNTHROID, LEVOTHROID) 100 MCG tablet Take 100 mcg by mouth daily before breakfast.    [provider]  ?memantine (NAMENDA) 10 MG tablet Take 1 tablet (10 mg total) by mouth 2 (two) times daily.  02/04/20   Debbora Presto, NP  ?   ? ?Allergies    ?Cephalexin, Entocort ec [budesonide], Lipitor [atorvastatin], Bee venom, and Escitalopram   ? ?Review of Systems   ?Review of Systems  ?Skin:  Positive for rash.  ?All other systems reviewed and are negative. ? ?Physical Exam ?Updated Vital Signs ?BP (!) 161/67 (BP Location: Right Arm)   Pulse 66   Temp 98.1 ?F (36.7 ?C) (Oral)   Resp 18   Ht 5' (1.524 m)   Wt 52.2 kg   SpO2 100%   BMI 22.46 kg/m?  ?Physical Exam ?Vitals and nursing note reviewed.  ?Constitutional:   ?   General: She is not in acute distress. ?   Appearance: Normal appearance. She is well-developed. She is not ill-appearing, toxic-appearing or diaphoretic.  ?HENT:  ?   Head: Normocephalic and atraumatic.  ?   Nose: No nasal deformity.  ?   Mouth/Throat:  ?   Lips: Pink. No lesions.  ?Eyes:  ?   General: Gaze aligned appropriately. No scleral icterus.    ?   Right eye: No discharge.     ?   Left eye: No discharge.  ?   Conjunctiva/sclera: Conjunctivae normal.  ?   Right eye: Right conjunctiva is not injected. No exudate or hemorrhage. ?   Left eye: Left conjunctiva is not injected. No exudate or hemorrhage. ?Pulmonary:  ?   Effort: Pulmonary effort is normal. No respiratory  distress.  ?Skin: ?   General: Skin is warm and dry.  ?   Comments: Urticarial rash on patient's back.  No rash present in any other part of her body.  ?Neurological:  ?   Mental Status: She is alert and oriented to person, place, and time.  ?Psychiatric:     ?   Mood and Affect: Mood normal.     ?   Speech: Speech normal.     ?   Behavior: Behavior normal. Behavior is cooperative.  ? ? ? ?ED Results / Procedures / Treatments   ?Labs ?(all labs ordered are listed, but only abnormal results are displayed) ?Labs Reviewed - No data to display ? ?EKG ?None ? ?Radiology ?No results found. ? ?Procedures ? ? ?Medications Ordered in ED ?Medications  ?predniSONE (DELTASONE) tablet 60 mg (60 mg Oral Given 12/06/21 1122)  ? ? ?ED Course/  Medical Decision Making/ A&P ?  ?                        ?Medical Decision Making ?Risk ?Prescription drug management. ? ? ? ?MDM  ?This is a 88 y.o. female who presents to the ED with rash ? ? ?My Impression, Plan, and ED Course: Well-appearing 88 year old female.  No acute distress.  Normal vitals.  Sound like this rash has been ongoing for several years now.  Seems to always respond with steroids.  Patient has tolerated steroids in the past.  I reviewed previous ED visit where patient was prescribed an initial 60 mg prednisone and burst for 6 days following this.  This seemed to work for her last time.  She does have upcoming follow-up with her PCP.  They are also working to get established with allergist which I think will be the primary thing that would help this patient prevent these episodes. ?There is no known trigger that I can establish on history.  Patient is nontoxic.  Doubt this is medication reaction or serious reaction such as soon Johnson's or TEN.  ? ?Rash is very nonspecific as there are some urticarial lesions on her back but no other part of her body.  See photo in physical exam. ? ?I will give patient prednisone burst for symptoms.  Apparently PCP has told her not to use Benadryl anymore because it no longer works for her.  Hopefully just the steroids will get the symptoms under control.  She needs to follow-up with her PCP.  Follow-up return precautions if needed. ? ? ?Charting Requirements ?Additional history is obtained from:  Independent historian and Child ?External Records from outside source obtained and reviewed including: Reviewed recent ED visit care plan. Reviewed office visist from 09/21/21 for urticaria ?Social Determinants of Health:  none ?Pertinant PMH that complicates patient's illness: Alzheimers disease. Chronic urticaria ? ?Patient Care ?Problems that were addressed during this visit: ?- Rash: Acute illness with complication ?Medications given in ED: Prednisone ?Reevaluation  of the patient after these medicines showed that the patient stayed the same ?I have reviewed home medications and made changes accordingly.  ?Disposition: Discharge. F/u with PCP. ? ?This is a supervised visit with my attending physician, Dr. Regenia Skeeter. We have discussed this patient and they have altered the plan as needed. ? ?Portions of this note were generated with Lobbyist. Dictation errors may occur despite best attempts at proofreading. ?  ? ?Final Clinical Impression(s) / ED Diagnoses ?Final diagnoses:  ?Rash  ? ? ?Rx / DC Orders ?ED Discharge  Orders   ? ?      Ordered  ?  predniSONE (DELTASONE) 10 MG tablet  Daily       ? 12/06/21 1114  ? ?  ?  ? ?  ? ? ?  ?Adolphus Birchwood, PA-C ?12/06/21 1124 ? ?  ?Sherwood Gambler, MD ?12/09/21 [phone] ? ?

## 2021-12-06 NOTE — ED Triage Notes (Signed)
C/o itchy, generalized rash, been using triamcinolone cream. States has been dealing with this intermittently x years, unsure of cause. Family states they have been trying to see an allergist ?

## 2022-01-17 DIAGNOSIS — J3089 Other allergic rhinitis: Secondary | ICD-10-CM | POA: Diagnosis not present

## 2022-01-17 DIAGNOSIS — R21 Rash and other nonspecific skin eruption: Secondary | ICD-10-CM | POA: Diagnosis not present

## 2022-02-06 DIAGNOSIS — J3089 Other allergic rhinitis: Secondary | ICD-10-CM | POA: Diagnosis not present

## 2022-02-06 DIAGNOSIS — R21 Rash and other nonspecific skin eruption: Secondary | ICD-10-CM | POA: Diagnosis not present

## 2022-02-09 ENCOUNTER — Encounter (HOSPITAL_BASED_OUTPATIENT_CLINIC_OR_DEPARTMENT_OTHER): Payer: Self-pay

## 2022-02-09 ENCOUNTER — Emergency Department (HOSPITAL_BASED_OUTPATIENT_CLINIC_OR_DEPARTMENT_OTHER)
Admission: EM | Admit: 2022-02-09 | Discharge: 2022-02-09 | Disposition: A | Discharge status: Home / Self Care | Payer: Medicare HMO | Attending: Emergency Medicine | Admitting: Emergency Medicine

## 2022-02-09 DIAGNOSIS — Z79899 Other long term (current) drug therapy: Secondary | ICD-10-CM | POA: Insufficient documentation

## 2022-02-09 DIAGNOSIS — G309 Alzheimer's disease, unspecified: Secondary | ICD-10-CM | POA: Diagnosis not present

## 2022-02-09 DIAGNOSIS — I10 Essential (primary) hypertension: Secondary | ICD-10-CM | POA: Diagnosis not present

## 2022-02-09 DIAGNOSIS — E039 Hypothyroidism, unspecified: Secondary | ICD-10-CM | POA: Insufficient documentation

## 2022-02-09 DIAGNOSIS — R21 Rash and other nonspecific skin eruption: Secondary | ICD-10-CM | POA: Insufficient documentation

## 2022-02-09 MED ORDER — PREDNISONE 10 MG (21) PO TBPK: ORAL_TABLET | Freq: Every day | ORAL | 0 refills | Status: AC

## 2022-02-09 MED ORDER — HYDROXYZINE HCL 10 MG PO TABS: 25.0000 mg | ORAL_TABLET | Freq: Four times a day (QID) | ORAL | 0 refills | Status: AC | PRN

## 2022-02-09 NOTE — ED Provider Notes (Signed)
[facility] EM[facility] DEPARTMENT Provider Note   CSN: [id number] Arrival date & time: 02/09/22  6144     History  Chief Complaint  Patient presents with   Rash    Alexandria Murphy is a 88 y.o. female.  88 year old female brought in by daughter with concern for rash with itching.  Patient has been applying triamcinolone cream with some improvement however daughter has noted that patient is constantly itches her back and is concerned her mother is uncomfortable.  Patient is using Free and clear detergent, Dove body wash without improvement.  No new medications.   Past medical history significant for hypertension, thyroid disease, hypothyroid, Alzheimer's       Home Medications Prior to Admission medications   Medication Sig Start Date End Date Taking? Authorizing Provider  hydrOXYzine (ATARAX) 10 MG tablet Take 2.5 tablets (25 mg total) by mouth every 6 (six) hours as needed for itching. 02/09/22  Yes Tacy Learn, PA-C  predniSONE (STERAPRED UNI-PAK 21 TAB) 10 MG (21) TBPK tablet Take by mouth daily. Take 6 tabs by mouth daily  for 2 days, then 5 tabs for 2 days, then 4 tabs for 2 days, then 3 tabs for 2 days, 2 tabs for 2 days, then 1 tab by mouth daily for 2 days 02/09/22  Yes Percell Miller Hewitt Shorts, PA-C  busPIRone (BUSPAR) 5 MG tablet Take 5 mg by mouth as needed.    [provider]  donepezil (ARICEPT) 10 MG tablet Take 1 tablet (10 mg total) by mouth at bedtime. 02/05/18   Melvenia Beam, MD  hydrochlorothiazide (HYDRODIURIL) 25 MG tablet Take 25 mg by mouth daily.    [provider]  levothyroxine (SYNTHROID, LEVOTHROID) 100 MCG tablet Take 100 mcg by mouth daily before breakfast.    [provider]  memantine (NAMENDA) 10 MG tablet Take 1 tablet (10 mg total) by mouth 2 (two) times daily. 02/04/20   Lomax, Amy, NP      Allergies    Cephalexin, Entocort ec [budesonide], Lipitor [atorvastatin], Bee venom, and Escitalopram    Review of  Systems   Review of Systems All 5 caveat for Alzheimer's, daughter at bedside to assist Physical Exam Updated Vital Signs BP (!) 144/74 (BP Location: Left Arm)   Pulse 62   Temp 97.9 F (36.6 C) (Oral)   Resp 16   Ht 5' (1.524 m)   Wt 52.2 kg   SpO2 98%   BMI 22.48 kg/m  Physical Exam Vitals and nursing note reviewed.  Constitutional:      General: She is not in acute distress.    Appearance: She is well-developed. She is not diaphoretic.  HENT:     Head: Normocephalic and atraumatic.  Pulmonary:     Effort: Pulmonary effort is normal.  Skin:    General: Skin is warm and dry.     Findings: Rash present.  Neurological:     Mental Status: She is alert and oriented to person, place, and time.  Psychiatric:        Behavior: Behavior normal.     ED Results / Procedures / Treatments   Labs (all labs ordered are listed, but only abnormal results are displayed) Labs Reviewed - No data to display  EKG None  Radiology No results found.  Procedures Procedures    Medications Ordered in ED Medications - No data to display  ED Course/ Medical Decision Making/ A&P  Medical Decision Making Risk Prescription drug management.   88 year old female brought in by daughter with concern for an itchy rash to her trunk.  On exam, she has a somewhat urticarial rash at this time without respiratory symptoms or GI symptoms.  This seems to be an ongoing chronic problem for this patient and is not responding to triamcinolone.  Advised patient to discontinue the topical triamcinolone, will prescribe short course of prednisone and limited hydroxyzine for her itching to help with her comfort.  Discussed importance of follow-up with allergy versus dermatology for further work-up for source of rash.        Final Clinical Impression(s) / ED Diagnoses Final diagnoses:  Rash    Rx / DC Orders ED Discharge Orders          Ordered    predniSONE (STERAPRED  UNI-PAK 21 TAB) 10 MG (21) TBPK tablet  Daily        02/09/22 1127    hydrOXYzine (ATARAX) 10 MG tablet  Every 6 hours PRN        02/09/22 1127              Tacy Learn, PA-C 02/09/22 1814    Hayden Rasmussen, MD 02/09/22 [phone]

## 2022-02-09 NOTE — ED Triage Notes (Signed)
Rash on back, seen here previously for same. Using a cream. NAD

## 2022-02-09 NOTE — ED Notes (Signed)
Discharge instructions reviewed with patient. Patient verbalizes understanding, no further questions at this time. Medications/prescriptions and follow up information provided. No acute distress noted at time of departure.  

## 2022-02-09 NOTE — Discharge Instructions (Signed)
Follow-up with allergist or see dermatology. Take prednisone by mouth as prescribed.  Do not apply your topical steroid while taking an oral steroid. Hydroxyzine as needed as prescribed for itching. Continue with your Zyrtec daily. Use a CeraVe a moisturizer regularly.

## 2022-02-20 DIAGNOSIS — J3089 Other allergic rhinitis: Secondary | ICD-10-CM | POA: Diagnosis not present

## 2022-02-20 DIAGNOSIS — R21 Rash and other nonspecific skin eruption: Secondary | ICD-10-CM | POA: Diagnosis not present

## 2022-04-25 DIAGNOSIS — R413 Other amnesia: Secondary | ICD-10-CM | POA: Diagnosis not present

## 2022-04-25 DIAGNOSIS — F419 Anxiety disorder, unspecified: Secondary | ICD-10-CM | POA: Diagnosis not present

## 2022-04-25 DIAGNOSIS — L209 Atopic dermatitis, unspecified: Secondary | ICD-10-CM | POA: Diagnosis not present

## 2022-04-25 DIAGNOSIS — R0602 Shortness of breath: Secondary | ICD-10-CM | POA: Diagnosis not present

## 2022-05-03 DIAGNOSIS — R7309 Other abnormal glucose: Secondary | ICD-10-CM | POA: Diagnosis not present

## 2022-05-03 DIAGNOSIS — Z13220 Encounter for screening for lipoid disorders: Secondary | ICD-10-CM | POA: Diagnosis not present

## 2022-05-03 DIAGNOSIS — Z79899 Other long term (current) drug therapy: Secondary | ICD-10-CM | POA: Diagnosis not present

## 2022-05-03 DIAGNOSIS — E559 Vitamin D deficiency, unspecified: Secondary | ICD-10-CM | POA: Diagnosis not present

## 2022-05-03 DIAGNOSIS — E039 Hypothyroidism, unspecified: Secondary | ICD-10-CM | POA: Diagnosis not present

## 2022-05-03 DIAGNOSIS — I10 Essential (primary) hypertension: Secondary | ICD-10-CM | POA: Diagnosis not present

## 2022-05-09 DIAGNOSIS — K5289 Other specified noninfective gastroenteritis and colitis: Secondary | ICD-10-CM | POA: Diagnosis not present

## 2022-05-09 DIAGNOSIS — K645 Perianal venous thrombosis: Secondary | ICD-10-CM | POA: Diagnosis not present

## 2022-05-10 DIAGNOSIS — F03911 Unspecified dementia, unspecified severity, with agitation: Secondary | ICD-10-CM | POA: Diagnosis not present

## 2022-05-10 DIAGNOSIS — E039 Hypothyroidism, unspecified: Secondary | ICD-10-CM | POA: Diagnosis not present

## 2022-05-10 DIAGNOSIS — I952 Hypotension due to drugs: Secondary | ICD-10-CM | POA: Diagnosis not present

## 2022-05-10 DIAGNOSIS — R945 Abnormal results of liver function studies: Secondary | ICD-10-CM | POA: Diagnosis not present

## 2022-05-10 DIAGNOSIS — K760 Fatty (change of) liver, not elsewhere classified: Secondary | ICD-10-CM | POA: Diagnosis not present

## 2022-05-10 DIAGNOSIS — K52832 Lymphocytic colitis: Secondary | ICD-10-CM | POA: Diagnosis not present

## 2022-05-10 DIAGNOSIS — Z0000 Encounter for general adult medical examination without abnormal findings: Secondary | ICD-10-CM | POA: Diagnosis not present

## 2022-06-11 DIAGNOSIS — I952 Hypotension due to drugs: Secondary | ICD-10-CM | POA: Diagnosis not present

## 2022-06-11 DIAGNOSIS — R945 Abnormal results of liver function studies: Secondary | ICD-10-CM | POA: Diagnosis not present

## 2022-06-11 DIAGNOSIS — E039 Hypothyroidism, unspecified: Secondary | ICD-10-CM | POA: Diagnosis not present

## 2022-06-27 DIAGNOSIS — Z23 Encounter for immunization: Secondary | ICD-10-CM | POA: Diagnosis not present

## 2022-06-27 DIAGNOSIS — B079 Viral wart, unspecified: Secondary | ICD-10-CM | POA: Diagnosis not present

## 2022-09-18 DIAGNOSIS — E039 Hypothyroidism, unspecified: Secondary | ICD-10-CM | POA: Diagnosis not present

## 2022-09-18 DIAGNOSIS — J069 Acute upper respiratory infection, unspecified: Secondary | ICD-10-CM | POA: Diagnosis not present

## 2022-09-18 DIAGNOSIS — E559 Vitamin D deficiency, unspecified: Secondary | ICD-10-CM | POA: Diagnosis not present

## 2022-09-18 DIAGNOSIS — I10 Essential (primary) hypertension: Secondary | ICD-10-CM | POA: Diagnosis not present

## 2022-09-18 DIAGNOSIS — R7309 Other abnormal glucose: Secondary | ICD-10-CM | POA: Diagnosis not present

## 2022-09-18 DIAGNOSIS — Z13220 Encounter for screening for lipoid disorders: Secondary | ICD-10-CM | POA: Diagnosis not present

## 2022-09-18 DIAGNOSIS — Z79899 Other long term (current) drug therapy: Secondary | ICD-10-CM | POA: Diagnosis not present

## 2022-10-03 DIAGNOSIS — H5203 Hypermetropia, bilateral: Secondary | ICD-10-CM | POA: Diagnosis not present

## 2022-10-03 DIAGNOSIS — H524 Presbyopia: Secondary | ICD-10-CM | POA: Diagnosis not present

## 2022-10-03 DIAGNOSIS — H52209 Unspecified astigmatism, unspecified eye: Secondary | ICD-10-CM | POA: Diagnosis not present

## 2023-01-19 DIAGNOSIS — J209 Acute bronchitis, unspecified: Secondary | ICD-10-CM | POA: Diagnosis not present

## 2023-01-19 DIAGNOSIS — E039 Hypothyroidism, unspecified: Secondary | ICD-10-CM | POA: Diagnosis not present

## 2023-01-19 DIAGNOSIS — G309 Alzheimer's disease, unspecified: Secondary | ICD-10-CM | POA: Diagnosis not present

## 2023-01-19 DIAGNOSIS — R509 Fever, unspecified: Secondary | ICD-10-CM | POA: Diagnosis not present

## 2023-01-19 DIAGNOSIS — Z6822 Body mass index (BMI) 22.0-22.9, adult: Secondary | ICD-10-CM | POA: Diagnosis not present

## 2023-01-19 DIAGNOSIS — F0280 Dementia in other diseases classified elsewhere without behavioral disturbance: Secondary | ICD-10-CM | POA: Diagnosis not present

## 2023-01-19 DIAGNOSIS — R059 Cough, unspecified: Secondary | ICD-10-CM | POA: Diagnosis not present

## 2023-01-23 DIAGNOSIS — Z13220 Encounter for screening for lipoid disorders: Secondary | ICD-10-CM | POA: Diagnosis not present

## 2023-01-23 DIAGNOSIS — Z0000 Encounter for general adult medical examination without abnormal findings: Secondary | ICD-10-CM | POA: Diagnosis not present

## 2023-01-23 DIAGNOSIS — F03911 Unspecified dementia, unspecified severity, with agitation: Secondary | ICD-10-CM | POA: Diagnosis not present

## 2023-01-23 DIAGNOSIS — J22 Unspecified acute lower respiratory infection: Secondary | ICD-10-CM | POA: Diagnosis not present

## 2023-01-23 DIAGNOSIS — E039 Hypothyroidism, unspecified: Secondary | ICD-10-CM | POA: Diagnosis not present

## 2023-01-23 DIAGNOSIS — I10 Essential (primary) hypertension: Secondary | ICD-10-CM | POA: Diagnosis not present

## 2023-01-23 DIAGNOSIS — R7309 Other abnormal glucose: Secondary | ICD-10-CM | POA: Diagnosis not present

## 2023-01-23 DIAGNOSIS — Z9109 Other allergy status, other than to drugs and biological substances: Secondary | ICD-10-CM | POA: Diagnosis not present

## 2023-01-23 DIAGNOSIS — Z79899 Other long term (current) drug therapy: Secondary | ICD-10-CM | POA: Diagnosis not present

## 2023-01-23 DIAGNOSIS — E559 Vitamin D deficiency, unspecified: Secondary | ICD-10-CM | POA: Diagnosis not present

## 2023-02-04 DIAGNOSIS — J189 Pneumonia, unspecified organism: Secondary | ICD-10-CM | POA: Diagnosis not present

## 2023-07-30 DIAGNOSIS — E559 Vitamin D deficiency, unspecified: Secondary | ICD-10-CM | POA: Diagnosis not present

## 2023-07-30 DIAGNOSIS — Z8719 Personal history of other diseases of the digestive system: Secondary | ICD-10-CM | POA: Diagnosis not present

## 2023-07-30 DIAGNOSIS — I10 Essential (primary) hypertension: Secondary | ICD-10-CM | POA: Diagnosis not present

## 2023-07-30 DIAGNOSIS — F419 Anxiety disorder, unspecified: Secondary | ICD-10-CM | POA: Diagnosis not present

## 2023-07-30 DIAGNOSIS — F03911 Unspecified dementia, unspecified severity, with agitation: Secondary | ICD-10-CM | POA: Diagnosis not present

## 2023-07-30 DIAGNOSIS — E039 Hypothyroidism, unspecified: Secondary | ICD-10-CM | POA: Diagnosis not present

## 2023-07-30 DIAGNOSIS — Z23 Encounter for immunization: Secondary | ICD-10-CM | POA: Diagnosis not present

## 2023-07-30 DIAGNOSIS — Z79899 Other long term (current) drug therapy: Secondary | ICD-10-CM | POA: Diagnosis not present

## 2023-07-30 DIAGNOSIS — Z9109 Other allergy status, other than to drugs and biological substances: Secondary | ICD-10-CM | POA: Diagnosis not present

## 2024-04-28 ENCOUNTER — Emergency Department (HOSPITAL_COMMUNITY)

## 2024-04-28 ENCOUNTER — Emergency Department (HOSPITAL_COMMUNITY)
Admission: EM | Admit: 2024-04-28 | Discharge: 2024-04-28 | Disposition: A | Discharge status: Home / Self Care | Source: Ambulatory Visit | Attending: Emergency Medicine | Admitting: Emergency Medicine

## 2024-04-28 ENCOUNTER — Emergency Department (EMERGENCY_DEPARTMENT_HOSPITAL)

## 2024-04-28 ENCOUNTER — Encounter (HOSPITAL_COMMUNITY): Payer: Self-pay | Admitting: Emergency Medicine

## 2024-04-28 DIAGNOSIS — E559 Vitamin D deficiency, unspecified: Secondary | ICD-10-CM | POA: Diagnosis not present

## 2024-04-28 DIAGNOSIS — S0121XA Laceration without foreign body of nose, initial encounter: Secondary | ICD-10-CM | POA: Diagnosis not present

## 2024-04-28 DIAGNOSIS — S51011A Laceration without foreign body of right elbow, initial encounter: Secondary | ICD-10-CM | POA: Diagnosis not present

## 2024-04-28 DIAGNOSIS — W2209XA Striking against other stationary object, initial encounter: Secondary | ICD-10-CM | POA: Diagnosis not present

## 2024-04-28 DIAGNOSIS — Y9289 Other specified places as the place of occurrence of the external cause: Secondary | ICD-10-CM | POA: Diagnosis not present

## 2024-04-28 DIAGNOSIS — S62616A Displaced fracture of proximal phalanx of right little finger, initial encounter for closed fracture: Secondary | ICD-10-CM | POA: Insufficient documentation

## 2024-04-28 DIAGNOSIS — S098XXA Other specified injuries of head, initial encounter: Secondary | ICD-10-CM

## 2024-04-28 DIAGNOSIS — E039 Hypothyroidism, unspecified: Secondary | ICD-10-CM | POA: Diagnosis not present

## 2024-04-28 DIAGNOSIS — W19XXXA Unspecified fall, initial encounter: Secondary | ICD-10-CM

## 2024-04-28 DIAGNOSIS — R7309 Other abnormal glucose: Secondary | ICD-10-CM | POA: Diagnosis not present

## 2024-04-28 DIAGNOSIS — S62606A Fracture of unspecified phalanx of right little finger, initial encounter for closed fracture: Secondary | ICD-10-CM | POA: Diagnosis not present

## 2024-04-28 DIAGNOSIS — W1809XA Striking against other object with subsequent fall, initial encounter: Secondary | ICD-10-CM | POA: Insufficient documentation

## 2024-04-28 DIAGNOSIS — S62617A Displaced fracture of proximal phalanx of left little finger, initial encounter for closed fracture: Secondary | ICD-10-CM | POA: Diagnosis not present

## 2024-04-28 DIAGNOSIS — G309 Alzheimer's disease, unspecified: Secondary | ICD-10-CM | POA: Diagnosis not present

## 2024-04-28 DIAGNOSIS — F03911 Unspecified dementia, unspecified severity, with agitation: Secondary | ICD-10-CM | POA: Diagnosis not present

## 2024-04-28 DIAGNOSIS — M25421 Effusion, right elbow: Secondary | ICD-10-CM | POA: Diagnosis not present

## 2024-04-28 DIAGNOSIS — S0181XA Laceration without foreign body of other part of head, initial encounter: Secondary | ICD-10-CM

## 2024-04-28 DIAGNOSIS — S61511A Laceration without foreign body of right wrist, initial encounter: Secondary | ICD-10-CM | POA: Diagnosis not present

## 2024-04-28 DIAGNOSIS — I10 Essential (primary) hypertension: Secondary | ICD-10-CM | POA: Diagnosis not present

## 2024-04-28 DIAGNOSIS — S51811A Laceration without foreign body of right forearm, initial encounter: Secondary | ICD-10-CM | POA: Insufficient documentation

## 2024-04-28 MED ORDER — LIDOCAINE HCL (PF) 1 % IJ SOLN
5.0000 mL | Freq: Once | INTRAMUSCULAR | Status: AC
  Administered 2024-04-28: 5 mL
  Filled 2024-04-28: qty 30

## 2024-04-28 NOTE — Discharge Instructions (Addendum)
 Follow-up with your primary care provider next week as scheduled. I have provided you with the contact information for a hand specialist, please schedule follow-up in regard to your finger fracture. Keep the right 4th and 5th digit buddy taped until you are seen by the hand specialist. Your laceration on the bridge of your nose was repaired using absorbable sutures, these will absorb/dissolve over time and do not need to be removed. Return to the emergency department if your symptoms worsen.

## 2024-04-28 NOTE — ED Triage Notes (Signed)
 89 y/o female comes in c/o a laceration on her nose after a mechanical fall about an hour ago. Pt reports she tripped over a curb and hit her face on the pavement. No blood thinners and tetanus not up to date. Bleeding controlled

## 2024-04-28 NOTE — ED Provider Notes (Signed)
 [place] EMERGENCY DEPARTMENT AT [facility] Provider Note   CSN: [id number] Arrival date & time: 04/28/24  1144     Patient presents with: Fall and Facial Laceration   Alexandria Murphy is a 89 y.o. female.   89 year old female presenting after a fall.  Patient is accompanied by family members who provide collateral information, patient fell outside of her doctor's office today, striking her face on the concrete.  There was no loss of consciousness, she is not on blood thinners.  She complains of pain to her right fifth/fourth digit, has skin tears to her right forearm, and a small laceration to the bridge of her nose.  Her primary care provider applied some Xeroform gauze over her skin tears and wrapped these before she presented to the emergency department today.  History of Alzheimer's, she is at her cognitive baseline according to her family members.   Fall       Prior to Admission medications   Medication Sig Start Date End Date Taking? Authorizing Provider  busPIRone (BUSPAR) 5 MG tablet Take 5 mg by mouth as needed.    [provider]  donepezil  (ARICEPT ) 10 MG tablet Take 1 tablet (10 mg total) by mouth at bedtime. 02/05/18   Ines Onetha NOVAK, MD  hydrochlorothiazide (HYDRODIURIL) 25 MG tablet Take 25 mg by mouth daily.    [provider]  hydrOXYzine  (ATARAX ) 10 MG tablet Take 2.5 tablets (25 mg total) by mouth every 6 (six) hours as needed for itching. 02/09/22   Beverley Leita DELENA, PA-C  levothyroxine (SYNTHROID, LEVOTHROID) 100 MCG tablet Take 100 mcg by mouth daily before breakfast.    [provider]  memantine  (NAMENDA ) 10 MG tablet Take 1 tablet (10 mg total) by mouth 2 (two) times daily. 02/04/20   Lomax, Amy, NP  predniSONE  (STERAPRED UNI-PAK 21 TAB) 10 MG (21) TBPK tablet Take by mouth daily. Take 6 tabs by mouth daily  for 2 days, then 5 tabs for 2 days, then 4 tabs for 2 days, then 3 tabs for 2 days, 2 tabs for 2 days, then 1 tab by  mouth daily for 2 days 02/09/22   Beverley Leita DELENA, PA-C    Allergies: Cephalexin , Entocort ec [budesonide], Lipitor [atorvastatin], Bee venom, and Escitalopram    Review of Systems  Updated Vital Signs  Vitals:   04/28/24 1152 04/28/24 1200  BP: (!) 151/95   Pulse: 68   Resp: 16   Temp: (!) 97.5 F (36.4 C)   TempSrc: Oral   SpO2: 100%   Weight:  59 kg  Height:  5' 1 (1.549 m)     Physical Exam Vitals and nursing note reviewed.  HENT:     Head: Normocephalic.      Comments: No bony tenderness to palpation of the face    Nose:      Comments: <54mm laceration to bridge of nose, not actively bleeding Eyes:     Extraocular Movements: Extraocular movements intact.     Pupils: Pupils are equal, round, and reactive to light.  Cardiovascular:     Rate and Rhythm: Normal rate.  Pulmonary:     Effort: Pulmonary effort is normal.  Musculoskeletal:     Cervical back: Normal range of motion. No rigidity or tenderness.     Comments: Move all extremities spontaneously without difficulty No pelvic/hips/knee/ankle bony TTP, full ROM RUE: Full ROM at shoulder/elbow/wrist. Limited flexion of 4th digit, limited flexion/extension of 5th digit without obvious deformity LUE: Full  ROM  Skin:    Comments: Skin tears and bruising to R forearm/elbow, see photos  Neurological:     Mental Status: She is alert. Mental status is at baseline.          (all labs ordered are listed, but only abnormal results are displayed) Labs Reviewed - No data to display  EKG: None  Radiology: CT Maxillofacial Wo Contrast Result Date: 04/28/2024 CLINICAL DATA:  Trauma EXAM: CT MAXILLOFACIAL W/O CM TECHNIQUE: Multidetector CT imaging of the maxillofacial structures was performed. Multiplanar CT image reconstructions were also generated. RADIATION DOSE REDUCTION: This exam was performed according to the departmental dose-optimization program which includes automated exposure control, adjustment of the mA  and/or kV according to patient size and/or use of iterative reconstruction technique. COMPARISON:  None Available. FINDINGS: The orbital walls and orbital rims are intact. The zygomatic arches are intact. The pterygoid plates are intact. There is no fluid seen in the paranasal sinuses. The mandible is intact. No nasal bone fracture identified. Other comments: None IMPRESSION: No fracture Electronically Signed   By: Nancyann Burns M.D.   On: 04/28/2024 15:34   CT Head Wo Contrast Result Date: 04/28/2024 CLINICAL DATA:  Trauma EXAM: CT HEAD WITHOUT CONTRAST TECHNIQUE: Contiguous axial images were obtained from the base of the skull through the vertex without intravenous contrast. RADIATION DOSE REDUCTION: This exam was performed according to the departmental dose-optimization program which includes automated exposure control, adjustment of the mA and/or kV according to patient size and/or use of iterative reconstruction technique. COMPARISON:  December 03, 2017 FINDINGS: CT HEAD: There is a calcified left frontal meningioma. There is no hemorrhage. No acute ischemic changes. The ventricles are normal. Skull/sinuses/orbits: No significant abnormality. IMPRESSION: Calcified left frontal meningioma unchanged compared with 2019. No hemorrhage or acute abnormality Electronically Signed   By: Nancyann Burns M.D.   On: 04/28/2024 15:31   DG Hand Complete Right Result Date: 04/28/2024 CLINICAL DATA:  Status post fall with laceration to right wrist and elbow. Pinky finger pain. EXAM: RIGHT FOREARM - 2 VIEW; RIGHT HAND - COMPLETE 3+ VIEW; RIGHT WRIST - COMPLETE 3+ VIEW; RIGHT ELBOW - COMPLETE 3+ VIEW COMPARISON:  None Available. FINDINGS: Hand: Oblique minimally displaced fifth digit proximal phalanx fracture. No intra-articular involvement. No additional fracture of the hand. Alignment is normal. 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 No radiopaque foreign body. Wrist: No  wrist fracture or dislocation. Mild degenerative changes radiocarpal joint space narrowing and carpal bone cysts. Mild soft tissue edema. No radiopaque foreign body. Forearm: No acute fracture. Cortical margins are intact. No focal soft tissue abnormality. Elbow: Questionable lucency projecting over the radial head. There is a small elbow joint effusion. Normal alignment, no dislocation. Small triceps enthesophyte. Mild soft tissue edema. No radiopaque foreign body. IMPRESSION: 1. Oblique minimally displaced fifth digit proximal phalanx fracture. 2. Questionable lucency projecting over the radial head with small elbow joint effusion. Recommend correlation for point tenderness. 3. No fracture of the wrist or forearm. Electronically Signed   By: Andrea Gasman M.D.   On: 04/28/2024 13:58   DG Wrist Complete Right Result Date: 04/28/2024 CLINICAL DATA:  Status post fall with laceration to right wrist and elbow. Pinky finger pain. EXAM: RIGHT FOREARM - 2 VIEW; RIGHT HAND - COMPLETE 3+ VIEW; RIGHT WRIST - COMPLETE 3+ VIEW; RIGHT ELBOW - COMPLETE 3+ VIEW COMPARISON:  None Available. FINDINGS: Hand: Oblique minimally displaced fifth digit proximal phalanx fracture. No intra-articular involvement.  No additional fracture of the hand. Alignment is normal. 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 No radiopaque foreign body. Wrist: No wrist fracture or dislocation. Mild degenerative changes radiocarpal joint space narrowing and carpal bone cysts. Mild soft tissue edema. No radiopaque foreign body. Forearm: No acute fracture. Cortical margins are intact. No focal soft tissue abnormality. Elbow: Questionable lucency projecting over the radial head. There is a small elbow joint effusion. Normal alignment, no dislocation. Small triceps enthesophyte. Mild soft tissue edema. No radiopaque foreign body. IMPRESSION: 1. Oblique minimally displaced fifth digit proximal phalanx  fracture. 2. Questionable lucency projecting over the radial head with small elbow joint effusion. Recommend correlation for point tenderness. 3. No fracture of the wrist or forearm. Electronically Signed   By: Andrea Gasman M.D.   On: 04/28/2024 13:58   DG Forearm Right Result Date: 04/28/2024 CLINICAL DATA:  Status post fall with laceration to right wrist and elbow. Pinky finger pain. EXAM: RIGHT FOREARM - 2 VIEW; RIGHT HAND - COMPLETE 3+ VIEW; RIGHT WRIST - COMPLETE 3+ VIEW; RIGHT ELBOW - COMPLETE 3+ VIEW COMPARISON:  None Available. FINDINGS: Hand: Oblique minimally displaced fifth digit proximal phalanx fracture. No intra-articular involvement. No additional fracture of the hand. Alignment is normal. 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 No radiopaque foreign body. Wrist: No wrist fracture or dislocation. Mild degenerative changes radiocarpal joint space narrowing and carpal bone cysts. Mild soft tissue edema. No radiopaque foreign body. Forearm: No acute fracture. Cortical margins are intact. No focal soft tissue abnormality. Elbow: Questionable lucency projecting over the radial head. There is a small elbow joint effusion. Normal alignment, no dislocation. Small triceps enthesophyte. Mild soft tissue edema. No radiopaque foreign body. IMPRESSION: 1. Oblique minimally displaced fifth digit proximal phalanx fracture. 2. Questionable lucency projecting over the radial head with small elbow joint effusion. Recommend correlation for point tenderness. 3. No fracture of the wrist or forearm. Electronically Signed   By: Andrea Gasman M.D.   On: 04/28/2024 13:58   DG Elbow Complete Right Result Date: 04/28/2024 CLINICAL DATA:  Status post fall with laceration to right wrist and elbow. Pinky finger pain. EXAM: RIGHT FOREARM - 2 VIEW; RIGHT HAND - COMPLETE 3+ VIEW; RIGHT WRIST - COMPLETE 3+ VIEW; RIGHT ELBOW - COMPLETE 3+ VIEW COMPARISON:  None  Available. FINDINGS: Hand: Oblique minimally displaced fifth digit proximal phalanx fracture. No intra-articular involvement. No additional fracture of the hand. Alignment is normal. 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 No radiopaque foreign body. Wrist: No wrist fracture or dislocation. Mild degenerative changes radiocarpal joint space narrowing and carpal bone cysts. Mild soft tissue edema. No radiopaque foreign body. Forearm: No acute fracture. Cortical margins are intact. No focal soft tissue abnormality. Elbow: Questionable lucency projecting over the radial head. There is a small elbow joint effusion. Normal alignment, no dislocation. Small triceps enthesophyte. Mild soft tissue edema. No radiopaque foreign body. IMPRESSION: 1. Oblique minimally displaced fifth digit proximal phalanx fracture. 2. Questionable lucency projecting over the radial head with small elbow joint effusion. Recommend correlation for point tenderness. 3. No fracture of the wrist or forearm. Electronically Signed   By: Andrea Gasman M.D.   On: 04/28/2024 13:58     .Laceration Repair  Date/Time: 04/28/2024 1:21 PM  Performed by: Glendia Rocky SAILOR, PA-C Authorized by: Glendia Rocky SAILOR, PA-C   Consent:    Consent obtained:  Verbal   Consent given by:  Patient   Risks, benefits, and alternatives were discussed: yes     Risks discussed:  Infection, pain, retained foreign body, tendon damage, vascular damage, poor wound healing, poor cosmetic result, need for additional repair and nerve damage   Alternatives discussed:  No treatment Universal protocol:    Patient identity confirmed:  Verbally with patient Anesthesia:    Anesthesia method:  Local infiltration   Local anesthetic:  Lidocaine  1% w/o epi Laceration details:    Location:  Face   Face location:  Nose   Length (cm):  0.5 Exploration:    Hemostasis achieved with:  Direct pressure   Imaging outcome: foreign  body not noted     Wound exploration: entire depth of wound visualized   Treatment:    Area cleansed with:  Saline   Amount of cleaning:  Standard   Irrigation solution:  Sterile saline   Irrigation method:  Syringe   Visualized foreign bodies/material removed: no   Skin repair:    Repair method:  Sutures   Suture size:  6-0   Wound skin closure material used: Vicryl.   Suture technique:  Simple interrupted   Number of sutures:  2 Approximation:    Approximation:  Close Repair type:    Repair type:  Simple Post-procedure details:    Dressing:  Open (no dressing)   Procedure completion:  Tolerated well, no immediate complications .Laceration Repair  Date/Time: 04/28/2024 1:23 PM  Performed by: Glendia Rocky SAILOR, PA-C Authorized by: Glendia Rocky SAILOR, PA-C   Consent:    Consent obtained:  Verbal   Consent given by:  Patient   Risks, benefits, and alternatives were discussed: yes     Risks discussed:  Infection, pain, retained foreign body, tendon damage, vascular damage, nerve damage, poor wound healing, poor cosmetic result and need for additional repair   Alternatives discussed:  No treatment Universal protocol:    Patient identity confirmed:  Verbally with patient Anesthesia:    Anesthesia method:  None Laceration details:    Location:  Shoulder/arm   Arm location: R forearm/wrist and R upper arm.   Length (cm):  6 Pre-procedure details:    Preparation:  Patient was prepped and draped in usual sterile fashion Treatment:    Area cleansed with:  Saline   Amount of cleaning:  Standard   Irrigation solution:  Sterile saline   Irrigation method:  Syringe   Visualized foreign bodies/material removed: no   Skin repair:    Repair method:  Steri-Strips   Number of Steri-Strips:  6 Approximation:    Approximation:  Close Repair type:    Repair type:  Simple Post-procedure details:    Dressing:  Non-adherent dressing   Procedure completion:  Tolerated well, no immediate  complications    Medications Ordered in the ED  lidocaine  (PF) (XYLOCAINE ) 1 % injection 5 mL (has no administration in time range)                                    Medical Decision Making This patient presents to the ED for concern of fall, this involves an extensive number of treatment options, and is a complaint that carries with it a high risk of complications and morbidity.  The differential diagnosis includes fracture, dislocation, sprain, laceration, skin tear/abrasion, intracranial hemorrhage   Co morbidities that complicate the patient evaluation  Alzheimer's   Additional history  obtained:  Additional history obtained from record review External records from outside source obtained and reviewed including prior ED note  Imaging Studies ordered:  I ordered imaging studies including CT head, CT maxillofacial, XR R hand/wrist/forearm/elbow  I independently visualized and interpreted imaging which showed  - CT head / maxillofacial: Calcified left frontal meningioma unchanged compared with 2019. No hemorrhage or acute abnormality. No fracture. - XRs RUE: 1. Oblique minimally displaced fifth digit proximal phalanx fracture. 2. Questionable lucency projecting over the radial head with small elbow joint effusion. Recommend correlation for point tenderness. 3. No fracture of the wrist or forearm.  I agree with the radiologist interpretation   Cardiac Monitoring: / EKG:  The patient was maintained on a cardiac monitor.  I personally viewed and interpreted the cardiac monitored which showed an underlying rhythm of: NSR  Problem List / ED Course / Critical interventions / Medication management I have reviewed the patients home medicines and have made adjustments as needed  Test / Admission - Considered:  Physical exam notable as above.  See above for laceration/skin care repair procedure details.  X-ray imaging is notable for an oblique minimally displaced fifth digit  proximal phalanx fracture, as well as questionable lucency projecting over the radial head.  Patient does not have any tenderness to palpation at the radial head/elbow, and she has full range of motion, I have a very low suspicion that she has an acute injury to her elbow at this time.  Patient's rings on her fourth digit of her right hand had to be removed using a ring cutter due to swelling of the digit, her 4th and 5th digits were then buddy taped together. Return precautions discussed, I have provided the patient/her daughter with the contact information for hand specialist to follow-up in regard to her fracture as above.  She is scheduled to follow-up with her primary care provider next week.  I advised her daughter to continue to administer Tylenol  as needed for pain.  She is appropriate for discharge at this time.    Amount and/or Complexity of Data Reviewed Radiology: ordered.  Risk Prescription drug management.       Final diagnoses:  Fall, initial encounter  Closed displaced fracture of proximal phalanx of right little finger, initial encounter  Facial laceration, initial encounter    ED Discharge Orders     None          Glendia Rocky LOISE DEVONNA 04/28/24 1613    Garrick Charleston, MD 04/28/24 1701

## 2024-05-05 DIAGNOSIS — I10 Essential (primary) hypertension: Secondary | ICD-10-CM | POA: Diagnosis not present

## 2024-05-05 DIAGNOSIS — Z23 Encounter for immunization: Secondary | ICD-10-CM | POA: Diagnosis not present

## 2024-05-05 DIAGNOSIS — Z9109 Other allergy status, other than to drugs and biological substances: Secondary | ICD-10-CM | POA: Diagnosis not present

## 2024-05-05 DIAGNOSIS — E039 Hypothyroidism, unspecified: Secondary | ICD-10-CM | POA: Diagnosis not present

## 2024-05-05 DIAGNOSIS — F03911 Unspecified dementia, unspecified severity, with agitation: Secondary | ICD-10-CM | POA: Diagnosis not present

## 2024-05-05 DIAGNOSIS — J22 Unspecified acute lower respiratory infection: Secondary | ICD-10-CM | POA: Diagnosis not present

## 2024-05-05 DIAGNOSIS — Z0000 Encounter for general adult medical examination without abnormal findings: Secondary | ICD-10-CM | POA: Diagnosis not present

## 2024-05-05 DIAGNOSIS — T07XXXA Unspecified multiple injuries, initial encounter: Secondary | ICD-10-CM | POA: Diagnosis not present

## 2024-05-20 DIAGNOSIS — S62616A Displaced fracture of proximal phalanx of right little finger, initial encounter for closed fracture: Secondary | ICD-10-CM | POA: Diagnosis not present

## 2024-06-30 DIAGNOSIS — L309 Dermatitis, unspecified: Secondary | ICD-10-CM | POA: Diagnosis not present
